# Patient Record
Sex: Female | Born: 1985 | Hispanic: Yes | Marital: Married | Smoking: Never smoker
Health system: Southern US, Community
[De-identification: ages and names within clinical notes are randomized; demographics above are authoritative.]

## PROBLEM LIST (undated history)

## (undated) ENCOUNTER — Inpatient Hospital Stay (HOSPITAL_COMMUNITY): Payer: Self-pay

## (undated) DIAGNOSIS — R112 Nausea with vomiting, unspecified: Secondary | ICD-10-CM

## (undated) DIAGNOSIS — J45909 Unspecified asthma, uncomplicated: Secondary | ICD-10-CM

## (undated) DIAGNOSIS — Z9889 Other specified postprocedural states: Secondary | ICD-10-CM

## (undated) HISTORY — DX: Nausea with vomiting, unspecified: R11.2

## (undated) HISTORY — PX: OVARIAN CYST REMOVAL: SHX89

## (undated) HISTORY — DX: Other specified postprocedural states: Z98.890

---

## 2005-03-19 DIAGNOSIS — IMO0002 Reserved for concepts with insufficient information to code with codable children: Secondary | ICD-10-CM

## 2016-03-19 ENCOUNTER — Encounter (HOSPITAL_COMMUNITY): Payer: Self-pay | Admitting: *Deleted

## 2016-03-19 ENCOUNTER — Inpatient Hospital Stay (HOSPITAL_COMMUNITY)
Admission: AD | Admit: 2016-03-19 | Discharge: 2016-03-19 | Disposition: A | Discharge status: Home / Self Care | Payer: Self-pay | Source: Ambulatory Visit | Attending: Obstetrics and Gynecology | Admitting: Obstetrics and Gynecology

## 2016-03-19 DIAGNOSIS — B3731 Acute candidiasis of vulva and vagina: Secondary | ICD-10-CM

## 2016-03-19 DIAGNOSIS — Z3A15 15 weeks gestation of pregnancy: Secondary | ICD-10-CM | POA: Insufficient documentation

## 2016-03-19 DIAGNOSIS — B373 Candidiasis of vulva and vagina: Secondary | ICD-10-CM | POA: Insufficient documentation

## 2016-03-19 DIAGNOSIS — O2342 Unspecified infection of urinary tract in pregnancy, second trimester: Secondary | ICD-10-CM | POA: Insufficient documentation

## 2016-03-19 DIAGNOSIS — O98812 Other maternal infectious and parasitic diseases complicating pregnancy, second trimester: Secondary | ICD-10-CM | POA: Insufficient documentation

## 2016-03-19 HISTORY — DX: Unspecified asthma, uncomplicated: J45.909

## 2016-03-19 LAB — WET PREP, GENITAL
CLUE CELLS WET PREP: NONE SEEN
Sperm: NONE SEEN
Trich, Wet Prep: NONE SEEN

## 2016-03-19 LAB — URINALYSIS, ROUTINE W REFLEX MICROSCOPIC
Bilirubin Urine: NEGATIVE
GLUCOSE, UA: NEGATIVE mg/dL
KETONES UR: NEGATIVE mg/dL
Nitrite: POSITIVE — AB
PROTEIN: NEGATIVE mg/dL
Specific Gravity, Urine: >1.03 — ABNORMAL HIGH (ref 1.005–1.030)
pH: 5 (ref 5.0–8.0)

## 2016-03-19 LAB — URINE MICROSCOPIC-ADD ON: RBC / HPF: NONE SEEN RBC/hpf (ref 0–5)

## 2016-03-19 MED ORDER — PHENAZOPYRIDINE HCL 100 MG PO TABS
200.0000 mg | ORAL_TABLET | Freq: Once | ORAL | Status: AC
  Administered 2016-03-19: 200 mg via ORAL
  Filled 2016-03-19: qty 2

## 2016-03-19 MED ORDER — CEPHALEXIN 500 MG PO CAPS: 500.0000 mg | ORAL_CAPSULE | Freq: Four times a day (QID) | ORAL | Status: DC

## 2016-03-19 MED ORDER — MICONAZOLE NITRATE 2 % VA CREA: 1.0000 | TOPICAL_CREAM | Freq: Every day | VAGINAL | Status: DC

## 2016-03-19 MED ORDER — NITROFURANTOIN MONOHYD MACRO 100 MG PO CAPS
100.0000 mg | ORAL_CAPSULE | Freq: Once | ORAL | Status: AC
  Administered 2016-03-19: 100 mg via ORAL
  Filled 2016-03-19: qty 1

## 2016-03-19 MED ORDER — PHENAZOPYRIDINE HCL 200 MG PO TABS: 200.0000 mg | ORAL_TABLET | Freq: Three times a day (TID) | ORAL | Status: DC | PRN

## 2016-03-19 NOTE — MAU Note (Addendum)
Pain in LLQ for 3 days. Feels like pressure. Denies LOF or bleeding. States just [place] 2 [place] ago from [place]. Had u/s there and was given EDC of 09-05-16. LMP 11-30-15. Was considered high risk due to previous fetal demises at 4612 and 20wks. 2 prior C/S in [place]

## 2016-03-19 NOTE — MAU Provider Note (Signed)
Chief Complaint: Abdominal Pain   First Provider Initiated Contact with Patient 03/19/16 1511     SUBJECTIVE HPI: Valerie [name] is a 30 y.o. [id number] at [redacted]w[redacted]d who presents to Maternity Admissions reporting LLQ pain x 3 days. Has [place] in [place] 2 weeks ago showing live baby, S=D and possible separated amnion per pt.   Location: Left lower quadrant Quality: Pressure Severity: 6/10 on pain scale Duration: 3 days Context: None Timing: Intermittent Modifying factors: Worse with leg bent Associated signs and symptoms: Positive for urinary frequency and urgency. Negative for fever, chills, hematuria, vaginal bleeding, vaginal discharge.   History of IUFD at 6 months. Patient states baby did not grow well. Also history of three-month SAB or baby did not grow per patient. Patient plans to get prenatal care at [facility] Department, but does not have first appointment scheduled yet.  Past Medical History  Diagnosis Date  . Asthma    OB History  Gravida Para Term Preterm AB SAB TAB Ectopic Multiple Living  5 3 2 1 1 1    2     # Outcome Date GA Lbr Len/2nd Weight Sex Delivery Anes PTL Lv  5 Current           4 Term 07/08/10     CS-LTranv   Y  3 Term 08/25/07    Valerie [name]   Y  2 SAB 03/19/06          1 Preterm 03/19/05 [redacted]w[redacted]d       FD     Complications: Fetal growth restriction     Past Surgical History  Procedure Laterality Date  . Cesarean section     Social History   Social History  . Marital Status: Married    Spouse Name: N/A  . Number of Children: N/A  . Years of Education: N/A   Occupational History  . Not on file.   Social History Main Topics  . Smoking status: Never Smoker   . Smokeless tobacco: Not on file  . Alcohol Use: No  . Drug Use: No  . Sexual Activity: Yes   Other Topics Concern  . Not on file   Social History Narrative  . No narrative on file   No current facility-administered medications on file prior to encounter.   No current  outpatient prescriptions on file prior to encounter.   No Known Allergies  I have reviewed the past Medical Hx, Surgical Hx, Social Hx, Allergies and Medications.   Review of Systems  Constitutional: Negative for fever and chills.  Gastrointestinal: Positive for abdominal pain. Negative for nausea, vomiting, diarrhea, constipation and abdominal distention.  Genitourinary: Positive for urgency and frequency. Negative for dysuria, flank pain, vaginal bleeding and vaginal discharge.  Musculoskeletal: Negative for back pain.    OBJECTIVE Patient Vitals for the past 24 hrs:  BP Temp Pulse Resp Height Weight  03/19/16 1639 (!) 116/54 mmHg - (!) 57 18 - -  03/19/16 1435 119/62 mmHg 97.8 F (36.6 C) 71 18 5\' 5"  (1.651 m) 215 lb 12.8 oz (97.886 kg)   Constitutional: Well-developed, well-nourished female in no acute distress.  Cardiovascular: normal rate Respiratory: normal rate and effort.  GI: Abd soft, mild SP tenderness, gravid appropriate for gestational age. Pos BS x 4 MS: Extremities nontender, no edema, normal ROM Neurologic: Alert and oriented x 4.  GU: Neg CVAT.  SPECULUM EXAM: NEFG, moderate amount of thick, whie, curd-like discharge, no blood noted, cervix clean  BIMANUAL: cervix closed; uterus 16-week  size, no adnexal tenderness or masses. No CMT.  LAB RESULTS Results for orders placed or performed during the hospital encounter of 03/19/16 (from the past 24 hour(s))  Urinalysis, Routine w reflex microscopic (not at [facility])     Status: Abnormal   Collection Time: 03/19/16  2:30 PM  Result Value Ref Range   Color, Urine YELLOW YELLOW   APPearance HAZY (A) CLEAR   Specific Gravity, Urine >1.030 (H) 1.005 - 1.030   pH 5.0 5.0 - 8.0   Glucose, UA NEGATIVE NEGATIVE mg/dL   Hgb urine dipstick TRACE (A) NEGATIVE   Bilirubin Urine NEGATIVE NEGATIVE   Ketones, ur NEGATIVE NEGATIVE mg/dL   Protein, ur NEGATIVE NEGATIVE mg/dL   Nitrite POSITIVE (A) NEGATIVE   Leukocytes, UA LARGE  (A) NEGATIVE  Urine microscopic-add on     Status: Abnormal   Collection Time: 03/19/16  2:30 PM  Result Value Ref Range   Squamous Epithelial / LPF 6-30 (A) NONE SEEN   WBC, UA 6-30 0 - 5 WBC/hpf   RBC / HPF NONE SEEN 0 - 5 RBC/hpf   Bacteria, UA MANY (A) NONE SEEN   Crystals CA OXALATE CRYSTALS (A) NEGATIVE  Wet prep, genital     Status: Abnormal   Collection Time: 03/19/16  3:25 PM  Result Value Ref Range   Yeast Wet Prep HPF POC PRESENT (A) NONE SEEN   Trich, Wet Prep NONE SEEN NONE SEEN   Clue Cells Wet Prep HPF POC NONE SEEN NONE SEEN   WBC, Wet Prep HPF POC MANY (A) NONE SEEN   Sperm NONE SEEN     IMAGING No results found.  MAU COURSE UA, Wet prep, GC/Chlamydia, Urine culture. Macrobid given.   MDM - UTI w/out evidence of Pyelo. Pt non-toxic appearing.  - Pos FHTs - VVC  ASSESSMENT 1. UTI in pregnancy, antepartum, second trimester   2. Vaginal yeast infection     PLAN Discharge home in stable condition. Second trimester Precautions Urine culture, GC/Chlamydia pending. Rx Keflex OTC Monistat     Follow-up Information    Follow up with [facility].   Why:  Start prenatal care   Contact information:   [address] [place] [address] [phone]       Follow up with [facility].   Specialty:  Obstetrics and Gynecology   Why:  Will call you to schedule appointment. If an appointment is available at [facility] outpatient clinic before the health department please cancel department appointment and keep your appointment at the clinic.   Contact information:   [address] [place] [address] [phone]      Follow up with [facility].   Why:  As needed if symptoms worsen   Contact information:   [address] [id number] mc [place] [address] [phone]       Medication List    TAKE these medications        cephALEXin  500 MG capsule  Commonly known as:  KEFLEX  Take 1 capsule (500 mg total) by mouth 4 (four) times daily.     folic acid 400 MCG tablet  Commonly known as:  FOLVITE  Take 400 mcg by mouth daily.     miconazole 2 % vaginal cream  Commonly known as:  MONISTAT 7  Place 1 Applicatorful vaginally at bedtime.     phenazopyridine 200 MG tablet  Commonly known as:  PYRIDIUM  Take 1 tablet (  200 mg total) by mouth 3 (three) times daily as needed for pain.     prenatal multivitamin Tabs tablet  Take 1 tablet by mouth daily at 12 noon.         [place] Davionne Dowty, CNM 03/19/2016  4:43 PM

## 2016-03-19 NOTE — Discharge Instructions (Signed)
Embarazo e infeccin del tracto urinario (Pregnancy and Urinary Tract Infection) Una infeccin urinaria (IU) puede ocurrir en Clinical cytogeneticist del tracto urinario. La infeccin urinaria puede [employer] utteres, los riones (pielonefritis), la vejiga (cistitis) y Geologist, engineering (uretritis). Todas las mujeres embarazadas deben ser estudiadas para diagnosticar la presencia de bacterias en el tracto urinario. La identificacin y el tratamiento de una infeccin urinaria disminuye el riesgo de un parto prematuro y de Actor infecciones ms graves en la madre y el beb. CAUSAS Las bacterias causan casi todas las infecciones urinarias.  FACTORES DE RIESGO Hay muchos factores que pueden aumentar sus probabilidades de contraer una infeccin urinaria (IU) durante el [place]. Pueden ser:  Lucilla Edin uretra corta.  Falta de aseo y malos hbitos de higiene.  [place].  Obstruccin de la orina en el tracto urinario.  Problemas con los msculos o nervios plvicos.  Diabetes.  Obesidad.  Problemas en la vejiga despus de tener varios hijos.  Antecedentes de infeccin urinaria. SIGNOS Y SNTOMAS   Dolor, ardor o sensacin de ardor al [employer].  Sentir la necesidad de Garment/textile technologist de inmediato [place]).  Prdida del control vesical (incontinencia urinaria).  Orinar con ms frecuencia de lo comn en el embarazo.  Malestar en la zona inferior del abdomen o en la espalda.  Bennie Hind turbia.  Sangre en la orina (hematuria).  Cristy Hilts. Cuando se infectan los riones, los sntomas pueden ser:  Dolor de espalda.  Dolor lateral en el lado derecho ms que en el lado izquierdo.  Cristy Hilts.  Escalofros.  Nuseas.  Vmitos. DIAGNSTICO  Una infeccin del tracto urinario se suele diagnosticar a travs de la orina. A veces se realizan pruebas y procedimientos adicionales. Estos pueden ser:  Steward Drone de los riones, los urteres, la vejiga y Geologist, engineering.  Observar la vejiga con un  tubo que ilumina (cistoscopa). TRATAMIENTO Por lo general, las IU pueden tratarse con medicamentos antibiticos.  INSTRUCCIONES PARA EL CUIDADO EN EL HOGAR   Tome slo medicamentos de venta libre o recetados, segn las indicaciones del mdico. Si le han recetado antibiticos, tmelos segn las indicaciones. Tmelos todos, aunque se sienta mejor.  Beba suficiente lquido para Consulting civil engineer orina clara o de color amarillo plido.  No tenga relaciones sexuales hasta que la infeccin haya desaparecido o el mdico la autorice.  Asegrese de [place] [employer] hagan estudios para Hydrographic surveyor una infeccin urinaria durante el [place]. Estas infecciones suelen reaparecer. Para prevenir una infeccin urinaria en el futuro  Practique buenos hbitos higinicos. Siempre debe limpiarse desde adelante hacia atrs. Use el tissue slo una vez.  No retenga la orina. Orine tan pronto como sea posible cuando tenga ganas.  No se haga duchas vaginales ni use desodorantes en aerosol.  Lave con agua tibia y jabn alrededor de la zona genital y el ano.  Vace la vejiga antes y despus de Clinical biochemist.  Use ropa interior con algodn en la entrepierna.  Evite la cafena y las bebidas gaseosas. Estas sustancias irritan la vejiga.  Beba jugo de arndanos o tome comprimidos de arndano. Esto puede disminuir el riesgo de sufrir una infeccin urinaria.  No beba alcohol.  Cumpla con las visitas de control y hgase todos los anlisis segn lo programado. SOLICITE ATENCIN MDICA SI:   Los sntomas empeoran.  Tiene fiebre an despus de 2 das [place].  Tiene una erupcin.  Siente que usted tiene problemas con los medicamentos recetados.  Tiene flujo vaginal anormal. SOLICITE ATENCIN MDICA DE INMEDIATO SI:  Siente dolor en la espalda o a los lados.  Tiene escalofros.  Observa sangre en la orina.  Tiene nuseas o vmitos.  Siente contracciones en el tero.  Tiene una  perdida de lquido en chorro por la vagina. ASEGRESE DE QUE:  Comprende estas instrucciones.   Controlar su afeccin.   Recibir ayuda de inmediato si no mejora o si empeora.    Esta informacin no tiene Theme park manager el consejo del mdico. Asegrese de hacerle al mdico cualquier pregunta que tenga.   Document Released: 07/01/2012 Document Revised: 07/28/2013 Elsevier Interactive Patient Education 2016 [employer].   Vaginitis monilisica (Monilial Vaginitis) La vaginitis es una inflamacin (irritacin, hinchazn) de la vagina y la vulva. Esta no es una enfermedad de transmisin sexual.  CAUSAS Este tipo de vaginitis lo causa un hongo (candida) que normalmente se encuentra en la vagina. El hongo candida se ha desarrollado hasta el punto de ocasionar problemas en el equilibrio qumico. SNTOMAS  Secrecin vaginal espesa y blanca.  Hinchazn, picazn, enrojecimiento e inflamacin de la vagina y en algunos casos de los labios vaginales (vulva).  Ardor o dolor al [employer].  Dolor en las relaciones sexuales. DIAGNSTICO Los factores que favorecen la vaginitis moniliasica son:  Everlean Patterson de virginidad y postmenopusicas.  Embarazo.  Infecciones.  Sentir cansancio, estar enferma o estresada, especialmente si ya ha sufrido este problema en el pasado.  Diabetes Buen control ayudar a disminur la probabilidad.  Pldoras anticonceptivas  Ropa interior [place].  El uso de espumas de bao, aerosoles femeninos duchas vaginales o tampones con desodorante.  Algunos antibiticos (medicamentos que destruyen grmenes).  Si contrae alguna enfermedad puede sufrir recurrencias espordicas. TRATAMIENTO El profesional que lo asiste prescribir medicamentos.  Hay diferentes tipos de cremas y supositorios vaginales que tratan especficamente la vaginitis monilisica. Para infecciones por hongos recurrentes, utilice un supositorio o crema en la vagina dos veces por semana, o  segn se le indique.  Tambin podrn utilizarse cremas con corticoides o anti monilisicas para la picazn o la irritacin de la vulva. Consulte con el profesional que la asiste.  Si la crema no da resultado, podr aplicarse en la vagina una solucin con azul de metileno.  El consumo de yogur puede prevenir este tipo de vaginitis. INSTRUCCIONES PARA EL CUIDADO DOMICILIARIO  Tome todos los medicamentos tal como se le indic.  No mantenga relaciones sexuales hasta que el tratamiento se haya completado, o segn las indicaciones del profesional que la asiste.  Tome baos de asiento tibios.  No se aplique duchas vaginales.  No utilice tampones, especialmente los perfumados.  Use ropa interior de algodn  Mirant pantalones ajustados y las medias tipo panty.  Comunique a sus compaeros sexuales que sufre una infeccin por hongos. Ellos deben concurrir para un control mdico si tienen sntomas como una urticaria leve o picazn.  Sus compaeros sexuales deben tratarse tambin si la infeccin es difcil de Pharmacologist.  Practique el sexo seguro - use condones  Algunos medicamentos vaginales ocasionan fallas en los condones de ltex. Los medicamentos vaginales que pueden daar los condones son:  Chiropodist cleocina  Butoconazole (Femstat)  Terconazole (Terazol) supositorios vaginales  Miconazole (Monistat) (es un medicamento de venta libre) SOLICITE ATENCIN MDICA SI:  Daphane Shepherd tiene una temperatura oral de ms de 38,9 C (102 F).  Si la infeccin empeora luego de 2 [address]  Si la infeccin no mejora luego de 3 [address].  Aparecen ampollas en o alrededor de la vagina.  Si aparece Neomia Dear  hemorragia vaginal y no es el momento del perodo.  Siente dolor al ConocoPhillipsorinar.  Presenta problemas intestinales.  Tiene dolor durante las The St. Paul Travelersrelaciones sexuales.   Esta informacin no tiene Theme park managercomo fin reemplazar el consejo del mdico. Asegrese de hacerle al mdico cualquier  pregunta que tenga.   Document Released: 07/17/2005 Document Revised: 12/30/2011 Elsevier Interactive Patient Education [employer]2016 Elsevier Inc.

## 2016-03-19 NOTE — Progress Notes (Signed)
Ivonne AndrewV. Smith CNM in to discuss test results and d/c plan to include prescriptions. Viria, Spanish interpreter, helping with d/c instructions. Written and verbal d/c instructions given and understanding voiced.

## 2016-03-19 NOTE — Progress Notes (Signed)
I assisted CNM [place] with some questions about her medical history, by Orlan LeavensViria Alvarez Spanish Interpreter.

## 2016-03-20 LAB — GC/CHLAMYDIA PROBE AMP (~~LOC~~) NOT AT ARMC
CHLAMYDIA, DNA PROBE: NEGATIVE
Neisseria Gonorrhea: NEGATIVE

## 2016-03-22 LAB — CULTURE, OB URINE
Culture: >=100000 — AB
SPECIAL REQUESTS: NORMAL

## 2016-04-29 ENCOUNTER — Encounter (HOSPITAL_COMMUNITY): Payer: Self-pay | Admitting: Obstetrics and Gynecology

## 2016-04-29 ENCOUNTER — Encounter: Payer: Self-pay | Admitting: Obstetrics and Gynecology

## 2016-04-29 ENCOUNTER — Ambulatory Visit (INDEPENDENT_AMBULATORY_CARE_PROVIDER_SITE_OTHER): Payer: BLUE CROSS/BLUE SHIELD | Admitting: Obstetrics and Gynecology

## 2016-04-29 VITALS — BP 109/56 | HR 71 | Wt 212.0 lb

## 2016-04-29 DIAGNOSIS — Z113 Encounter for screening for infections with a predominantly sexual mode of transmission: Secondary | ICD-10-CM

## 2016-04-29 DIAGNOSIS — Z124 Encounter for screening for malignant neoplasm of cervix: Secondary | ICD-10-CM

## 2016-04-29 DIAGNOSIS — O99820 Streptococcus B carrier state complicating pregnancy: Secondary | ICD-10-CM | POA: Insufficient documentation

## 2016-04-29 DIAGNOSIS — O09292 Supervision of pregnancy with other poor reproductive or obstetric history, second trimester: Secondary | ICD-10-CM

## 2016-04-29 DIAGNOSIS — Z1151 Encounter for screening for human papillomavirus (HPV): Secondary | ICD-10-CM

## 2016-04-29 DIAGNOSIS — O9989 Other specified diseases and conditions complicating pregnancy, childbirth and the puerperium: Secondary | ICD-10-CM

## 2016-04-29 DIAGNOSIS — O0990 Supervision of high risk pregnancy, unspecified, unspecified trimester: Secondary | ICD-10-CM | POA: Insufficient documentation

## 2016-04-29 DIAGNOSIS — J45909 Unspecified asthma, uncomplicated: Secondary | ICD-10-CM | POA: Insufficient documentation

## 2016-04-29 DIAGNOSIS — O2342 Unspecified infection of urinary tract in pregnancy, second trimester: Secondary | ICD-10-CM

## 2016-04-29 DIAGNOSIS — Z98891 History of uterine scar from previous surgery: Secondary | ICD-10-CM | POA: Insufficient documentation

## 2016-04-29 DIAGNOSIS — O2340 Unspecified infection of urinary tract in pregnancy, unspecified trimester: Secondary | ICD-10-CM

## 2016-04-29 DIAGNOSIS — IMO0002 Reserved for concepts with insufficient information to code with codable children: Secondary | ICD-10-CM

## 2016-04-29 DIAGNOSIS — O99519 Diseases of the respiratory system complicating pregnancy, unspecified trimester: Secondary | ICD-10-CM

## 2016-04-29 DIAGNOSIS — Z8759 Personal history of other complications of pregnancy, childbirth and the puerperium: Secondary | ICD-10-CM

## 2016-04-29 DIAGNOSIS — Z3490 Encounter for supervision of normal pregnancy, unspecified, unspecified trimester: Secondary | ICD-10-CM

## 2016-04-29 LAB — POCT URINALYSIS DIP (DEVICE)
Glucose, UA: NEGATIVE mg/dL
HGB URINE DIPSTICK: NEGATIVE
KETONES UR: NEGATIVE mg/dL
Nitrite: NEGATIVE
PH: 5.5 (ref 5.0–8.0)
PROTEIN: NEGATIVE mg/dL
Specific Gravity, Urine: 1.025 (ref 1.005–1.030)
Urobilinogen, UA: 0.2 mg/dL (ref 0.0–1.0)

## 2016-04-29 MED ORDER — PRENATAL VITAMINS 0.8 MG PO TABS: 1.0000 | ORAL_TABLET | Freq: Every day | ORAL | Status: DC

## 2016-04-29 NOTE — Progress Notes (Signed)
[name] used for interpreter for check in

## 2016-04-29 NOTE — Progress Notes (Signed)
Subjective:  Valerie [name] is a 30 y.o. [phone] at 7239w4d being seen today for initial prenatal care.  She is currently monitored for the following issues for this high-risk pregnancy and has Asthma affecting pregnancy, antepartum; Supervision of low-risk pregnancy; History IUFD (intrauterine fetal death); UTI in pregnancy; and History of 2 cesarean sections on her problem list.  Patient reports no complaints.  Contractions: Not present. Vag. Bleeding: None.  Movement: Present. Denies leaking of fluid.   The following portions of the patient's history were reviewed and updated as appropriate: allergies, current medications, past family history, past medical history, past social history, past surgical history and problem list. Problem list updated.  Objective:   Filed Vitals:   04/29/16 1006  BP: 109/56  Pulse: 71  Weight: 212 lb (96.163 kg)    Fetal Status:     Movement: Present     General:  Alert, oriented and cooperative. Patient is in no acute distress.  Skin: Skin is warm and dry. No rash noted.   Cardiovascular: Normal heart rate noted  Respiratory: Normal respiratory effort, no problems with respiration noted  Abdomen: Soft, gravid, appropriate for gestational age. Pain/Pressure: Absent     Pelvic:  Cervical exam deferred        Extremities: Normal range of motion.  Edema: None  Mental Status: Normal mood and affect. Normal behavior. Normal judgment and thought content.   Urinalysis: Urine Protein: Negative Urine Glucose: Negative  Assessment and Plan:  Pregnancy: [id number] at 3839w4d  1. Pregnancy - Prenatal Profile - Hemoglobinopathy evaluation - Culture, OB Urine - Pain Mgmt, Profile 6 Conf w/o mM, U - Glucose Tolerance, 1 HR (50g) w/o Fasting - GC/Chlamydia probe amp ([place] - US MFM OB COMP + 14 WK; Future - AMB referral to maternal fetal medicine  2. Asthma affecting pregnancy, antepartum - mild intermittent  3. Supervision of low-risk  pregnancy, unspecified trimester - AFP, Quad Screen  4. History IUFD (intrauterine fetal death) - meet w/ mfm after u/s to determine whether antenatal testing recommended  5. UTI in pregnancy, unspecified trimester  toc today  6. History of 2 cesarean sections Desires repeat   Preterm labor symptoms and general obstetric precautions including but not limited to vaginal bleeding, contractions, leaking of fluid and fetal movement were reviewed in detail with the patient. Please refer to After Visit Summary for other counseling recommendations.  F/u 4 wks  Kathrynn RunningNoah Bedford Fadi Menter, MD

## 2016-04-30 LAB — GC/CHLAMYDIA PROBE AMP (~~LOC~~) NOT AT ARMC
Chlamydia: NEGATIVE
NEISSERIA GONORRHEA: NEGATIVE

## 2016-04-30 LAB — PAIN MGMT, PROFILE 6 CONF W/O MM, U
6 ACETYLMORPHINE: NEGATIVE ng/mL (ref <10)
AMPHETAMINES: NEGATIVE ng/mL (ref <500)
Alcohol Metabolites: NEGATIVE ng/mL (ref <500)
BARBITURATES: NEGATIVE ng/mL (ref <300)
Benzodiazepines: NEGATIVE ng/mL (ref <100)
CREATININE: 217 mg/dL (ref >=20.0)
Cocaine Metabolite: NEGATIVE ng/mL (ref <150)
Marijuana Metabolite: NEGATIVE ng/mL (ref <20)
Methadone Metabolite: NEGATIVE ng/mL (ref <100)
OXIDANT: NEGATIVE ug/mL (ref <200)
Opiates: NEGATIVE ng/mL (ref <100)
Oxycodone: NEGATIVE ng/mL (ref <100)
PH: 6.56 (ref 4.5–9.0)
PHENCYCLIDINE: NEGATIVE ng/mL (ref <25)
Please note:: 0

## 2016-04-30 LAB — GLUCOSE TOLERANCE, 1 HOUR (50G) W/O FASTING: Glucose, 1 Hr, gestational: 130 mg/dL (ref <140)

## 2016-05-01 LAB — PRENATAL PROFILE (SOLSTAS)
Antibody Screen: NEGATIVE
BASOS ABS: 0 {cells}/uL (ref 0–200)
Basophils Relative: 0 %
EOS ABS: 294 {cells}/uL (ref 15–500)
Eosinophils Relative: 3 %
HCT: 35 % (ref 35.0–45.0)
HEP B S AG: NEGATIVE
HIV: NONREACTIVE
Hemoglobin: 11 g/dL — ABNORMAL LOW (ref 11.7–15.5)
LYMPHS ABS: 1568 {cells}/uL (ref 850–3900)
LYMPHS PCT: 16 %
MCH: 24.1 pg — ABNORMAL LOW (ref 27.0–33.0)
MCHC: 31.4 g/dL — AB (ref 32.0–36.0)
MCV: 76.8 fL — ABNORMAL LOW (ref 80.0–100.0)
MONO ABS: 490 {cells}/uL (ref 200–950)
MPV: 10.3 fL (ref 7.5–12.5)
Monocytes Relative: 5 %
NEUTROS PCT: 76 %
Neutro Abs: 7448 cells/uL (ref 1500–7800)
PLATELETS: 278 10*3/uL (ref 140–400)
RBC: 4.56 MIL/uL (ref 3.80–5.10)
RDW: 17.3 % — AB (ref 11.0–15.0)
RH TYPE: POSITIVE
RUBELLA: 4.06 {index} — AB (ref <0.90)
WBC: 9.8 10*3/uL (ref 3.8–10.8)

## 2016-05-01 LAB — CULTURE, OB URINE
COLONY COUNT: NO GROWTH
ORGANISM ID, BACTERIA: NO GROWTH

## 2016-05-07 LAB — AFP, QUAD SCREEN
AFP: 47.1 ng/mL
CURR GEST AGE: 21.6 wk
Down Syndrome Scr Risk Est: 1:753 {titer}
HCG, Total: 24.05 IU/mL
INH: 151.1 pg/mL
Interpretation-AFP: NEGATIVE
MOM FOR AFP: 0.83
MOM FOR INH: 1.07
MoM for hCG: 1.72
Open Spina bifida: NEGATIVE
Tri 18 Scr Risk Est: NEGATIVE
UE3 MOM: 0.79
UE3 VALUE: 1.88 ng/mL

## 2016-05-10 ENCOUNTER — Ambulatory Visit (HOSPITAL_COMMUNITY)
Admission: RE | Admit: 2016-05-10 | Discharge: 2016-05-10 | Disposition: A | Discharge status: Home / Self Care | Payer: BLUE CROSS/BLUE SHIELD | Source: Ambulatory Visit | Attending: Obstetrics and Gynecology | Admitting: Obstetrics and Gynecology

## 2016-05-10 ENCOUNTER — Other Ambulatory Visit (HOSPITAL_COMMUNITY): Payer: Self-pay

## 2016-05-10 ENCOUNTER — Encounter (HOSPITAL_COMMUNITY): Payer: Self-pay

## 2016-05-10 ENCOUNTER — Other Ambulatory Visit: Payer: Self-pay | Admitting: Obstetrics and Gynecology

## 2016-05-10 DIAGNOSIS — IMO0002 Reserved for concepts with insufficient information to code with codable children: Secondary | ICD-10-CM

## 2016-05-10 DIAGNOSIS — O9989 Other specified diseases and conditions complicating pregnancy, childbirth and the puerperium: Secondary | ICD-10-CM | POA: Diagnosis not present

## 2016-05-10 DIAGNOSIS — Z98891 History of uterine scar from previous surgery: Secondary | ICD-10-CM

## 2016-05-10 DIAGNOSIS — Z3490 Encounter for supervision of normal pregnancy, unspecified, unspecified trimester: Secondary | ICD-10-CM

## 2016-05-10 DIAGNOSIS — O34219 Maternal care for unspecified type scar from previous cesarean delivery: Secondary | ICD-10-CM | POA: Diagnosis not present

## 2016-05-10 DIAGNOSIS — Z36 Encounter for antenatal screening of mother: Secondary | ICD-10-CM | POA: Insufficient documentation

## 2016-05-10 DIAGNOSIS — J45909 Unspecified asthma, uncomplicated: Secondary | ICD-10-CM | POA: Diagnosis not present

## 2016-05-10 DIAGNOSIS — Z3A23 23 weeks gestation of pregnancy: Secondary | ICD-10-CM | POA: Diagnosis not present

## 2016-05-10 DIAGNOSIS — O09299 Supervision of pregnancy with other poor reproductive or obstetric history, unspecified trimester: Secondary | ICD-10-CM

## 2016-05-10 DIAGNOSIS — O99519 Diseases of the respiratory system complicating pregnancy, unspecified trimester: Secondary | ICD-10-CM

## 2016-05-10 DIAGNOSIS — O09292 Supervision of pregnancy with other poor reproductive or obstetric history, second trimester: Secondary | ICD-10-CM | POA: Diagnosis not present

## 2016-05-10 DIAGNOSIS — O2342 Unspecified infection of urinary tract in pregnancy, second trimester: Secondary | ICD-10-CM

## 2016-05-10 DIAGNOSIS — Z1389 Encounter for screening for other disorder: Secondary | ICD-10-CM

## 2016-05-10 DIAGNOSIS — O2340 Unspecified infection of urinary tract in pregnancy, unspecified trimester: Secondary | ICD-10-CM

## 2016-05-10 NOTE — Progress Notes (Signed)
30 year old, [id number], currently st [redacted] weeks gestation seen in consultation today for history of IUFD.   Past Medical History  Diagnosis Date  . Asthma   . PONV (postoperative nausea and vomiting)    Past Surgical History  Procedure Laterality Date  . Cesarean section    . Ovarian cyst removal     No family history on file.  Social History   Social History  . Marital Status: Married    Spouse Name: N/A  . Number of Children: N/A  . Years of Education: N/A   Occupational History  . Not on file.   Social History Main Topics  . Smoking status: Never Smoker   . Smokeless tobacco: Never Used  . Alcohol Use: No  . Drug Use: No  . Sexual Activity: Yes   Other Topics Concern  . Not on file   Social History Narrative   No Known Allergies  Current Outpatient Prescriptions on File Prior to Encounter  Medication Sig Dispense Refill  . Prenatal Multivit-Min-Fe-FA (PRENATAL VITAMINS) 0.8 MG tablet Take 1 tablet by mouth daily. 90 tablet 4  . Prenatal Vit-Fe Fumarate-FA (PRENATAL MULTIVITAMIN) TABS tablet Take 1 tablet by mouth daily at 12 noon.     No current facility-administered medications on file prior to encounter.   OB History  Gravida Para Term Preterm AB SAB TAB Ectopic Multiple Living  5 3 2 1 1 1  0 0 0 2    # Outcome Date GA Lbr Len/2nd Weight Sex Delivery Anes PTL Lv  5 Current           4 Term 07/08/10 3223w0d  7 lb 11.5 oz (3.5 kg) M CS-LTranv  N Y     Comments: was on bed rest early in pregnancy due to placenta (location ?)  3 Term 08/25/07 223w0d  7 lb 11.5 oz (3.5 kg) M CS-LTranv   Y     Comments: scheduled c/s due to umbilical cord wrapped around neck  2 SAB 03/19/06          1 Preterm 03/19/05 4917w0d       FD     Complications: Fetal growth restriction     Impression and Recommendations #1 History of intrauterine fetal demise at ~[redacted] weeks gestation - She notes through a translator that the baby was normally for that point in pregnancy and did not have  apparent chromosomal abnormality or external birth defects.  - She has had 2 normally grown term pregnancies - Ultrasound today had a normally grown fetus with normal appearing amniotic fluid and no apparent birth defects - Will repeat ultrasound in ~4-6 weeks. If continues normally grown, consider antenatal testing at ~ [redacted] weeks gestation. If not normally growing, consider starting antenatal testing sooner as clinically indicated. #2 History of 3 cesarean deliveries in [place] (including one at ~[redacted] weeks gestation per her report) - Counseling by primary provider on review of operative notes  Questions appear answered to her satisfaction. Precaution for the above given. Spent greater than 1/2 of 20 minute visit face to face counseling

## 2016-05-17 ENCOUNTER — Inpatient Hospital Stay (HOSPITAL_COMMUNITY)
Admission: AD | Admit: 2016-05-17 | Discharge: 2016-05-17 | Disposition: A | Discharge status: Home / Self Care | Payer: BLUE CROSS/BLUE SHIELD | Source: Ambulatory Visit | Attending: Obstetrics & Gynecology | Admitting: Obstetrics & Gynecology

## 2016-05-17 ENCOUNTER — Encounter (HOSPITAL_COMMUNITY): Payer: Self-pay | Admitting: *Deleted

## 2016-05-17 DIAGNOSIS — J45909 Unspecified asthma, uncomplicated: Secondary | ICD-10-CM | POA: Diagnosis not present

## 2016-05-17 DIAGNOSIS — O99512 Diseases of the respiratory system complicating pregnancy, second trimester: Secondary | ICD-10-CM | POA: Diagnosis not present

## 2016-05-17 DIAGNOSIS — Z3A24 24 weeks gestation of pregnancy: Secondary | ICD-10-CM

## 2016-05-17 DIAGNOSIS — R0602 Shortness of breath: Secondary | ICD-10-CM | POA: Diagnosis present

## 2016-05-17 DIAGNOSIS — J069 Acute upper respiratory infection, unspecified: Secondary | ICD-10-CM | POA: Diagnosis not present

## 2016-05-17 DIAGNOSIS — J45901 Unspecified asthma with (acute) exacerbation: Secondary | ICD-10-CM | POA: Diagnosis not present

## 2016-05-17 DIAGNOSIS — O99519 Diseases of the respiratory system complicating pregnancy, unspecified trimester: Secondary | ICD-10-CM

## 2016-05-17 MED ORDER — ALBUTEROL SULFATE HFA 108 (90 BASE) MCG/ACT IN AERS: 2.0000 | INHALATION_SPRAY | Freq: Four times a day (QID) | RESPIRATORY_TRACT | 2 refills | Status: DC | PRN

## 2016-05-17 NOTE — MAU Note (Signed)
Has had cold symptoms since Monday. Hx of asthma and having some difficulty breathing. Denies vag bleeding or pain.

## 2016-05-17 NOTE — MAU Note (Signed)
Urine in lab 

## 2016-05-17 NOTE — Discharge Instructions (Signed)
Asma - Adultos (Asthma, Adult) El asma es una enfermedad recurrente en la que las vas respiratorias se estrechan y [place]. Puede causar dificultad para respirar. Provoca tos, sibilancias y sensacin de falta de aire. Los episodios de asma, tambin llamados crisis de asma, pueden ser leves o potencialmente mortales. El asma no puede curarse, pero los [employer] y los cambios en el estilo de vida lo ayudarn a Aeronautical engineer enfermedad. CAUSAS Se cree que la causa del asma son factores hereditarios (genticos) y la exposicin a factores ambientales; sin embargo, su causa exacta se desconoce. El asma generalmente es desencadenada por alrgenos, infecciones en los pulmones o sustancias irritantes que se encuentran en el aire. Los desencadenantes del asma son diferentes para cada persona. Los factores desencadenantes comunes incluyen:   Caspa de los [place].  caros del polvo.  [place].  El polen de los rboles o el csped.  Moho.  Humo.  Sustancias contaminantes como el polvo, limpiadores del hogar, sprays para el cabello, aerosoles, vapores de pintura, sustancias qumicas fuertes u olores intensos.  El [place] fro, los cambios de Scientist, forensic y el viento (que aumenta la cantidad de moho y polen en el aire).  Emociones intensas, como llorar o rer [employer].  Estrs.  Ciertos medicamentos (como la aspirina) o algunos frmacos (como los betabloqueantes).  Los sulfitos que contienen los alimentos y las bebidas. Los alimentos y bebidas que pueden contener sulfitos son las frutas desecadas, las papas fritas y los vinos espumantes.  Enfermedades infecciosas o inflamatorias, como la gripe, el resfro o la inflamacin de las membranas nasales (rinitis).  El reflujo gastroesofgico (ERGE).  Los ejercicios o actividades extenuantes. SNTOMAS Los sntomas pueden ocurrir inmediatamente despus de que se desencadena el asma o muchas horas ms tarde. [place] sntomas  son:  Sibilancias.  Tos excesiva durante la noche o temprano por la maana.  Tos frecuente o intensa durante un resfro comn.  Opresin en el pecho.  Falta de aire. DIAGNSTICO  El diagnstico se realiza mediante la revisin de su historia clnica y de un examen fsico. Es posible que le indiquen algunos estudios. Estos pueden incluir:  Estudios de la funcin pulmonar. Estas pruebas indican cunto aire inhala y exhala.  Pruebas de alergia.  Estudios de diagnstico por imgenes, como radiografas. TRATAMIENTO  El asma no puede curarse, pero puede controlarse. El Tax inspector identificar y Product/process development scientist los factores desencadenantes del asma. Tambin incluye medicamentos. Hay dos tipos de medicamentos utilizados en el tratamiento para el asma:   Medicamentos de control del asma. Impiden que aparezcan los sntomas. Generalmente se [employer].  Medicamentos de [place] o de rescate. Alivian los sntomas rpidamente. Se utilizan cuando es necesario y proporcionan alivio a Control and instrumentation engineer. El mdico lo ayudar a Animal nutritionist de accin para el asma, que es una planificacin por escrito para el control y el tratamiento de las crisis de asma. Incluye una lista de los factores desencadenantes y el modo en que pueden evitarse. Tambin incluye informacin acerca del momento en que se deben Apple Computer y cundo se debe cambiar la dosis. Un plan de accin tambin incluye el uso de un dispositivo llamado espirmetro. El espirmetro es un dispositivo que mide el funcionamiento de los pulmones. Lo ayuda a controlar la enfermedad. [place] los medicamentos solamente como se lo haya indicado el mdico. Hable con el mdico si tiene preguntas acerca de cmo o cundo tomar los medicamentos.  Use un espirmetro de acuerdo  con las indicaciones del mdico. Anote y lleve un registro de los [place].  Conozca y [employer] de accin para ayudar  a minimizar o detener una crisis de asma sin necesidad de buscar atencin mdica.  Controle el ambiente de su hogar de la siguiente manera para prevenir las crisis de asma:  No fume. Evite la exposicin al humo de otros fumadores.  Cambie regularmente el filtro de la calefaccin y del aire acondicionado.  Limite el uso de hogares o estufas a lea.  Elimine las plagas (como cucarachas, ratones) y sus excrementos.  Elimine las plantas si observa moho en ellas.  Limpie habitualmente los pisos y el polvo. Utilice productos sin perfume.  Intente que otra persona pase la aspiradora con regularidad. Permanezca fuera de las habitaciones mientras son aspiradas y por algn tiempo despus. Si usted pasa la [place], use una mscara para polvo de las que se consiguen en la [place], una bolsa de aspiradora de doble capa o microfiltro o una aspiradora con un filtro HEPA.  Reemplace las alfombras por pisos de [place], baldosas o vinilo. Las alfombras pueden retener la caspa de los animales y [place].  Use almohadas, mantas y cubre colchones antialrgicos.  [place] sbanas y las mantas todas las semanas con agua caliente y squelas con aire caliente.  Use mantas de polister o algodn.  Limpie baos y cocinas con lavandina. Si fuera posible, pdale a alguien que vuelva a pintar las paredes de estas habitaciones con Ardelia Mems pintura resistente a los hongos. Aljese de las habitaciones que se estn limpiando y pintando.  Lvese las manos con frecuencia. SOLICITE ATENCIN MDICA SI:   Respira con dificultad an cuando toma el medicamento para prevenir las crisis.  La mucosidad coloreada que expectora cuando tose (esputo) es ms espesa que lo habitual.  Su esputo cambia de un color claro o blanco a un color amarillo, verde, gris o sanguinolento.  Tiene trastornos ocasionados por el medicamento que est tomando (como urticaria, picazn, hinchazn o dificultades respiratorias).  Necesita un  medicamento aliviador ms de 2 o 3 veces por semana.  Su flujo espiratorio mximo se mantiene entre el 50% y el 79% de su Pharmacist, hospital personal, despus de seguir el plan de accin durante 1hora.  Tiene fiebre. SOLICITE ATENCIN MDICA DE INMEDIATO SI:   Usted parece empeorar y no responde al tratamiento durante una crisis de asma.  Le falta el aire, incluso en reposo.  Le falta el aire an cuando hace muy poca actividad fsica.  Tiene dificultad para comer, beber o hablar debido a los sntomas del asma.  Siente dolor en el pecho.  Se le acelera la frecuencia cardaca.  Tiene los labios o las uas de tono [place].  Siente que est por desvanecerse, est mareado o se desmaya.  Su flujo mximo es Garment/textile technologist del 50% del Pharmacist, hospital personal.   Esta informacin no tiene Marine scientist el consejo del mdico. Asegrese de hacerle al mdico cualquier pregunta que tenga.   Document Released: 10/07/2005 Document Revised: 06/28/2015 Elsevier Interactive Patient Education 2016 Segundo crisis de asma (Asthma Attack Prevention) Si bien probablemente no pueda controlar ser [employer], puede tomar medidas para prevenir las crisis de asma. La mejor forma de prevenir las crisis de asma es mantener el asma bajo control. Para lograrlo, puede hacer lo siguiente:  Delphi segn las indicaciones.  Evite las cosas que pueden irritarle las vas respiratorias o intensificar los sntomas de  asma (desencadenantes del asma).  Lleve un registro Research officer, trade union control del asma y [employer]vin[employer]itor cambios en los sntomas.  Acte rpidamente si los sntomas de asma se intensifican (crisis de asma).  Busque atencin mdica de emergencia cuando sea necesario. CULES SON LAS FORMAS DE PREVENIR UNA CRISIS DE ASMA? Tenga un plan [place] con el mdico para crear Ardelia Mems planificacin por escrito para el control y el tratamiento de las crisis de asma (plan  de accin para el asma). Este plan incluye lo siguiente:  Una lista de los factores desencadenantes del asma y el modo en que puede evitarlos.  Informacin acerca del momento en que se deben tomar los medicamentos y cundo hay que cambiar las dosis.  El uso de un dispositivo que mide el funcionamiento de los pulmones (espirmetro). Controle el asma Use el espirmetro y [employer]  en un diario todos [place]. Una reduccin en los nmeros del flujo espiratorio mximo en uno o ms das puede indicar el comienzo de una crisis de asma. Esto puede ocurrir incluso antes de que empiece a Scientist, product/process development los sntomas. Para evitar que una crisis de asma empeore, siga los pasos del plan de accin para el asma. Evite los factores desencadenantes del asma [place] con el mdico especialista en asma para determinar cules son los factores desencadenantes. Esto puede lograrse de la siguiente manera:  Pruebas de Buyer, retail.  Llevar un diario que indique cundo ocurren las crisis de asma y cules son los factores que pueden haber contribuido a que estas sucedan.  Determinar si hay otras enfermedades que intensifican el asma. Una vez que haya determinado cules son los factores desencadenantes del asma, tome las medidas para evitarlos. Estas pueden incluir evitar la exposicin excesiva o prolongada a lo siguiente:  Polvo. Pdale a otra persona que limpie su casa y pase la aspiradora una o dos veces por semana. Es mejor usar una aspiradora con filtro de partculas de alto rendimiento (HEPA).  Humo. Esto incluye el humo de las fogatas, el humo de los incendios forestales y el humo ambiental de los productos que contienen tabaco.  Caspa de las [place]. Evite el contacto con los animales a los cuales sabe que es Air cabin crew.  Futures trader que provienen de los rboles, los pastos o el polen. No pase mucho tiempo al aire libre cuando las concentraciones de polen son elevadas y [place] son muy ventosos.  Aire  muy fro, seco o hmedo.  Moho.  Alimentos con grandes cantidades de sulfitos.  Olores fuertes.  Sustancias contaminantes del aire exterior, [employer] escapes de los motores.  Sustancias contaminantes del aire interior, como los [place] y los vapores de los productos de limpieza del Museum/gallery curator.  Plagas hogareas, entre ellas, los caros del polvo y las [place], y el excremento de las plagas.  Algunos medicamentos, incluidos los antiinflamatorios no esteroides (AINE). Hable siempre con el mdico antes de suspender o de empezar a tomar cualquier medicamento nuevo. Medicamentos Delphi de venta libre y los recetados solamente como se lo haya indicado el mdico. Muchas crisis de asma se pueden prevenir si se sigue cuidadosamente el plan de administracin de medicamentos. Es Scientist, research (life sciences) tomar los medicamentos del modo correcto cuando no se pueden evitar determinados factores desencadenantes del asma. Actuar con rapidez Si se produce una crisis de asma, actuar con rapidez puede atenuar su gravedad y reducir su duracin. Tome estas medidas:   Est atento a los sntomas. Si tiene tos, sibilancias o dificultad para respirar,  no espere hasta que los sntomas desaparezcan por s solos. Siga el plan de accin para el asma.  Si sigui el plan de accin para el asma y los sntomas no mejoran, llame al mdico o solicite atencin mdica de inmediato en el hospital ms cercano. Es importante que tenga en cuenta la frecuencia con la que tiene que usar el inhalador de rescate de accin rpida. Si est utilizando Forensic psychologist de rescate con ms frecuencia, tal vez esto signifique que el asma no est bajo control. Modificar el plan de tratamiento para controlar el asma puede ayudarlo a prevenir las crisis de asma en el futuro y a Scientist, forensic un mejor control de la enfermedad. CMO PUEDO EVITAR UNA CRISIS DE ASMA CUANDO HAGO ACTIVIDAD FSICA? Siga los consejos del mdico respecto de cundo  usar el Tax inspector de accin rpida antes de Field seismologist actividad fsica. Muchas personas que tienen asma sufren la broncoconstriccin inducida por el ejercicio (BCIE). Esta afeccin suele agravarse al realizar actividad fsica enrgica en ambientes fros, hmedos o secos. Generalmente, las personas con BCIE pueden mantenerse muy activas con un tratamiento previo con un inhalador de accin rpida antes de realizar actividad fsica.   Esta informacin no tiene Marine scientist el consejo del mdico. Asegrese de hacerle al mdico cualquier pregunta que tenga.   Document Released: 09/23/2012 Document Revised: 06/28/2015 Elsevier Interactive Patient Education [employer].

## 2016-05-17 NOTE — MAU Note (Signed)
Pt reports cold like symptoms since Sunday.Marland KitchenMarland KitchenMucus, cough, headaches, shortness of breath with walking and lying down. Pt has not had to use an inhaler in one year and does not have one at home. She is receiving care in the clinic and her next appointment is 8/7.

## 2016-05-17 NOTE — MAU Provider Note (Signed)
  History     CSN: [id number]  Arrival date and time: 05/17/16 1941   None     Chief Complaint  Patient presents with  . Shortness of Breath   HPI Ms Lluvia Gluth is a 30yo [id number] @ 24.1wks who presents for eval of SOB x 3d. She has had an URI since 6d. Denies fever. Feels her congestion/cough are improving. She has a hx of asthma but doesn't have an inhaler and no primary doc currently. Her preg has been [facility] the [facility] and has been remarkable for 1) hx C/S x 2 (possibly x 3) 2) hx IUFD@ 24wks from [facility] ([place]) 3) hx asthma. She denies ctx, leak, or bldg or any other preg concerns.  OB History    Gravida Para Term Preterm AB Living   5 3 2 1 1 2    SAB TAB Ectopic Multiple Live Births   1 0 0 0        Past Medical History:  Diagnosis Date  . Asthma   . PONV (postoperative nausea and vomiting)     Past Surgical History:  Procedure Laterality Date  . CESAREAN SECTION    . OVARIAN CYST REMOVAL      No family history on file.  Social History  Substance Use Topics  . Smoking status: Never Smoker  . Smokeless tobacco: Never Used  . Alcohol use No    Allergies: No Known Allergies  Prescriptions Prior to Admission  Medication Sig Dispense Refill Last Dose  . Prenatal Multivit-Min-Fe-FA (PRENATAL VITAMINS) 0.8 MG tablet Take 1 tablet by mouth daily. 90 tablet 4 05/17/2016 at Unknown time    ROS No other pertinents other than what is listed in HPI Physical Exam   Blood pressure 108/58, pulse 92, temperature 98.6 F (37 C), resp. rate 18, height 5\' 6"  (1.676 m), weight 97.5 kg (215 lb), last menstrual period 11/30/2015, SpO2 98 %.  Physical Exam  Constitutional: She is oriented to person, place, and time. She appears well-developed.  HENT:  Head: Normocephalic.  Neck: Normal range of motion.  Cardiovascular: Normal rate.   Respiratory: Effort normal and breath sounds normal.  Sl decreased  GI:  EFM 135-140s, +accels, no decels No ctx per toco   Musculoskeletal: Normal range of motion.  Neurological: She is alert and oriented to person, place, and time.  Skin: Skin is warm and dry.  Psychiatric: She has a normal mood and affect. Her behavior is normal. Thought content normal.    MAU Course  Procedures  MDM NST  Assessment and Plan  IUP@24 .1wks Asthma exacerbation secondary to recent URI  D/C home Rx Albuterol inhaler to use q 6hrs prn- offered breathing tx in MAU but pt declined Has OTC cold medication list F/U as scheduled at next OB visit   Cam Hai CNM 05/17/2016, 9:25 PM

## 2016-05-27 ENCOUNTER — Ambulatory Visit (INDEPENDENT_AMBULATORY_CARE_PROVIDER_SITE_OTHER): Payer: BLUE CROSS/BLUE SHIELD | Admitting: Obstetrics & Gynecology

## 2016-05-27 VITALS — BP 91/45 | HR 67 | Wt 212.9 lb

## 2016-05-27 DIAGNOSIS — IMO0002 Reserved for concepts with insufficient information to code with codable children: Secondary | ICD-10-CM

## 2016-05-27 DIAGNOSIS — Z98891 History of uterine scar from previous surgery: Secondary | ICD-10-CM

## 2016-05-27 DIAGNOSIS — O99512 Diseases of the respiratory system complicating pregnancy, second trimester: Secondary | ICD-10-CM

## 2016-05-27 DIAGNOSIS — Z8759 Personal history of other complications of pregnancy, childbirth and the puerperium: Secondary | ICD-10-CM

## 2016-05-27 DIAGNOSIS — Z3009 Encounter for other general counseling and advice on contraception: Secondary | ICD-10-CM | POA: Insufficient documentation

## 2016-05-27 DIAGNOSIS — J45909 Unspecified asthma, uncomplicated: Secondary | ICD-10-CM

## 2016-05-27 DIAGNOSIS — O99519 Diseases of the respiratory system complicating pregnancy, unspecified trimester: Secondary | ICD-10-CM

## 2016-05-27 DIAGNOSIS — Z3492 Encounter for supervision of normal pregnancy, unspecified, second trimester: Secondary | ICD-10-CM

## 2016-05-27 LAB — POCT URINALYSIS DIP (DEVICE)
Bilirubin Urine: NEGATIVE
Glucose, UA: NEGATIVE mg/dL
Hgb urine dipstick: NEGATIVE
KETONES UR: NEGATIVE mg/dL
Nitrite: NEGATIVE
PH: 5.5 (ref 5.0–8.0)
PROTEIN: 30 mg/dL — AB
Urobilinogen, UA: 0.2 mg/dL (ref 0.0–1.0)

## 2016-05-27 NOTE — Patient Instructions (Signed)
Informacin sobre Engineer, civil (consulting)la esterilizacin en las mujeres  (Sterilization Information, Female) La esterilizacin en la mujer es un procedimiento que se realiza para Location managerevitar el embarazo de Stap[place]manera permanente. Hay diferentes formas de Futures traderrealizar la esterilizacin, Biomedical engineerpero en todos los casos se bloquean o se cierran las trompas de [place] para que vulos no puedan llegar al tero. Si el vulo no llega al tero, los espermatozoides no fertilizan el vulo, y usted no podr Burundiquedar embarazada.  La esterilizacin se lleva a cabo por medio de un procedimiento quirrgico. A veces, estos procedimientos se realizan en el hospital haciendo dormir a Education officer, communityla paciente. En otros casos se realiza en un consultorio en una clnica y la paciente permanece despierta. Las trompas de NordstromFalopio se pueden cortar, Public affairs consultantligar o sellar quirrgicamente, con un procedimiento que se llama ligadura de trompas. Otro mtodo es cerrarlas con clips o anillos. La esterilizacin tambin puede realizarse mediante la colocacin de un pequeo resorte en cada trompa de [place], lo que hace que se desarrolle tejido cicatrizal en el interior de la trompa. Luego el tejido Verizoncicatrizal obstruye las trompas.   Comente el tema con su mdico para responder las inquietudes que usted o su pareja puedan Warehouse managertener. Podr preguntarle a su mdico qu tipo de Diplomatic Services operational officeresterilizacin se realizar. Algunos profesionales pueden no realizar todas las opciones. La esterilizacin es permanente y slo debe hacerse si est segura de que no desea tener hijos o no desea tener ms hijos. La reversin de la esterilizacin puede no tener xito.  PROCEDIMIENTOS PARA LA ESTERILIZACIN   Esterilizacin laparoscpica. Es un mtodo quirrgico que se Biomedical engineerrealiza en otro momento que no es inmediatamente despus del [place] Harborparto. Se realizan dos incisiones en la zona baja del abdomen. Un tubo delgado con una fuente de luz (laparoscopio) se inserta en una de las incisiones y se [place] ([place]) Islandsutiliza para Surveyor, quantityrealizar el procedimiento. Las trompas de  [place] se cierran con un anillo o un clip. Podrn utilizar un instrumento que utiliza el calor para sellar las trompas (electrocauterizacin).  Mini-laparotoma. Es un mtodo quirrgico que se Biomedical engineerrealiza 1 o 2 das despus del [place]arto. En general, consiste en una pequea incisin que se hace justo debajo del (ombligo) por el cual se visualizan las trompas de [place]alopio. Las trompas pueden ser selladas, atadas o cortadas.   Esterilizacin histeroscpica. Esto se realiza en otro momento que no sea inmediatamente despus del parto. Se coloca un pequeo resorte helicoidal a travs del cuello y del cuerpo del tero y se inserta en las trompas de ZeandaleFalopio. El resorte produce cicatrizacin y Thrivent Financialobstruye las trompas. Se deben utilizar otras formas de anticoncepcin durante 3 meses despus del procedimiento para permitir que el tejido cicatrizal se forme completamente. Adems, es necesario hacer una histerosalpingografa despus de 3 meses para asegurarse de que el procedimiento ha sido exitoso.  La histerosalpingografa es un procedimiento en el que utilizan rayos X para observar el tero y las trompas de [place] despus de insertar un dispositivo, para asegurarse de que est bien colocado. LA ESTERILIZACIN ES UN PROCEDIMIENTO SEGURO?  La esterilizacin se considera un procedimiento seguro en el que rara vez se producen complicaciones. Los riesgos dependen del tipo de procedimiento que se realicen. Al igual que con cualquier procedimiento quirrgico, puede haber riesgos. Algunos son:   Heron NaySangrado.  Infeccin.  Reaccin a la anestesia.  Lesin en los rganos circundantes. Los riesgos especficos de la colocacin de los resortes por histeroscopa son:   No se Production designer, theatre/television/filmpueden colocar correctamente la primera vez.   Las resortes pueden salirse del Environmental consultantlugar.  Las trompas no quedan completamente bloqueadas despus de 3 meses.   Ocurre una lesin en los rganos adyacentes al colocarlo.  LA ESTERILIZACIN ES UN MTODO  EFECTIVO?  La esterilizacin es efectiva en casi 100% , pero puede fallar. Segn el tipo de esterilizacin, el porcentaje de fracaso puede ser tan alto como en un 3%. Despus de la esterilizacin histeroscpica con colocacin de un resorte en las trompas de [place], Pension scheme manager un mtodo anticonceptivo de respaldo durante 3 meses. La esterilizacin es efectiva durante toda la vida.  LOS BENEFICIOS DE LA ESTERILIZACIN   No afecta las hormonas, y por lo tanto no afectar sus perodos menstruales, el deseo o el rendimiento sexual, .   Es efectiva para toda la vida.   Es segura.   No tiene que preocuparse por Location manager. Tenga en cuenta que si fue sometida a este procedimiento, debe esperar 3 meses (o hasta que el mdico lo confirme) antes de considerar que no quedar embarazada.   No hay efectos secundarios a diferencia de otros tipos de control de la natalidad (contracepcin).  INCONVENIENTES DE LA ESTERILIZACIN   Usted debe estar seguro de que no desea tener hijos o ms hijos. El procedimiento es [place].   No ofrece proteccin contra las infecciones de transmisin sexual (ITS).   Las trompas Hess Corporation a Engineer, building services. Si esto ocurre, habr riesgo de embarazo. Tambin hay un mayor riesgo (50%) que el embarazo sea ectpico. Se llama as al embarazo que ocurre fuera del tero.   Esta informacin no tiene Theme park manager el consejo del mdico. Asegrese de hacerle al mdico cualquier pregunta que tenga.   Document Released: 03/25/2008 Document Revised: 10/12/2013 Elsevier Interactive Patient Education [employer]

## 2016-05-27 NOTE — Progress Notes (Signed)
Subjective:  Valerie [name] is a 30 y.o. [phone] at 3016w4d being seen today for ongoing prenatal care.  She is currently monitored for the following issues for this low-risk pregnancy and has Asthma affecting pregnancy, antepartum; Supervision of low-risk pregnancy; History IUFD (intrauterine fetal death); UTI in pregnancy; History of 2 cesarean sections; and Sterilization consult on her problem list.  Patient reports no complaints.  Contractions: Not present. Vag. Bleeding: None.  Movement: Present. Denies leaking of fluid.   The following portions of the patient's history were reviewed and updated as appropriate: allergies, current medications, past family history, past medical history, past social history, past surgical history and problem list. Problem list updated.  Objective:   Vitals:   05/27/16 1535  BP: (!) 91/45  Pulse: 67  Weight: 212 lb 14.4 oz (96.6 kg)    Fetal Status: Fetal Heart Rate (bpm): 147   Movement: Present     General:  Alert, oriented and cooperative. Patient is in no acute distress.  Skin: Skin is warm and dry. No rash noted.   Cardiovascular: Normal heart rate noted  Respiratory: Normal respiratory effort, no problems with respiration noted  Abdomen: Soft, gravid, appropriate for gestational age. Pain/Pressure: Absent     Pelvic:  Cervical exam deferred        Extremities: Normal range of motion.  Edema: None  Mental Status: Normal mood and affect. Normal behavior. Normal judgment and thought content.   Urinalysis:      Assessment and Plan:  Pregnancy: [id number] at 416w4d  1. Supervision of low-risk pregnancy, second trimester  2. History of 2 cesarean sections Desires repeat with sterilzation Note sent to ofc to schedule  3. History IUFD (intrauterine fetal death) Antenatal testing at 32 weeks  4. Asthma affecting pregnancy, antepartum Stable  5. Sterilization consult No title XIX needed For BTL with sterilization  Preterm labor symptoms and  general obstetric precautions including but not limited to vaginal bleeding, contractions, leaking of fluid and fetal movement were reviewed in detail with the patient. Please refer to After Visit Summary for other counseling recommendations.  Return in about 4 weeks (around 06/24/2016).   Willodean Rosenthalarolyn Harraway-Smith, MD

## 2016-05-27 NOTE — Progress Notes (Signed)
Spanish video interpreter "Cristian" [phone]

## 2016-06-06 ENCOUNTER — Encounter: Payer: Self-pay | Admitting: Family Medicine

## 2016-06-14 ENCOUNTER — Encounter (HOSPITAL_COMMUNITY): Payer: Self-pay

## 2016-06-14 ENCOUNTER — Ambulatory Visit (HOSPITAL_COMMUNITY)
Admission: RE | Admit: 2016-06-14 | Discharge: 2016-06-14 | Disposition: A | Discharge status: Home / Self Care | Payer: BLUE CROSS/BLUE SHIELD | Source: Ambulatory Visit | Attending: Obstetrics and Gynecology | Admitting: Obstetrics and Gynecology

## 2016-06-14 DIAGNOSIS — Z3009 Encounter for other general counseling and advice on contraception: Secondary | ICD-10-CM

## 2016-06-14 DIAGNOSIS — IMO0002 Reserved for concepts with insufficient information to code with codable children: Secondary | ICD-10-CM

## 2016-06-14 DIAGNOSIS — O09293 Supervision of pregnancy with other poor reproductive or obstetric history, third trimester: Secondary | ICD-10-CM | POA: Diagnosis not present

## 2016-06-14 DIAGNOSIS — Z3A28 28 weeks gestation of pregnancy: Secondary | ICD-10-CM | POA: Insufficient documentation

## 2016-06-14 DIAGNOSIS — O34219 Maternal care for unspecified type scar from previous cesarean delivery: Secondary | ICD-10-CM | POA: Diagnosis not present

## 2016-06-17 ENCOUNTER — Other Ambulatory Visit (HOSPITAL_COMMUNITY): Payer: Self-pay | Admitting: *Deleted

## 2016-06-17 DIAGNOSIS — O09299 Supervision of pregnancy with other poor reproductive or obstetric history, unspecified trimester: Secondary | ICD-10-CM

## 2016-06-27 ENCOUNTER — Encounter (HOSPITAL_COMMUNITY): Payer: Self-pay | Admitting: *Deleted

## 2016-07-03 ENCOUNTER — Other Ambulatory Visit: Payer: Self-pay | Admitting: Obstetrics & Gynecology

## 2016-07-04 ENCOUNTER — Ambulatory Visit (INDEPENDENT_AMBULATORY_CARE_PROVIDER_SITE_OTHER): Payer: BLUE CROSS/BLUE SHIELD | Admitting: Family Medicine

## 2016-07-04 VITALS — BP 111/58 | HR 75 | Wt 217.5 lb

## 2016-07-04 DIAGNOSIS — J45909 Unspecified asthma, uncomplicated: Secondary | ICD-10-CM

## 2016-07-04 DIAGNOSIS — O9989 Other specified diseases and conditions complicating pregnancy, childbirth and the puerperium: Secondary | ICD-10-CM

## 2016-07-04 DIAGNOSIS — O26893 Other specified pregnancy related conditions, third trimester: Secondary | ICD-10-CM

## 2016-07-04 DIAGNOSIS — O99513 Diseases of the respiratory system complicating pregnancy, third trimester: Secondary | ICD-10-CM

## 2016-07-04 DIAGNOSIS — Z3493 Encounter for supervision of normal pregnancy, unspecified, third trimester: Secondary | ICD-10-CM

## 2016-07-04 DIAGNOSIS — Z98891 History of uterine scar from previous surgery: Secondary | ICD-10-CM

## 2016-07-04 DIAGNOSIS — N898 Other specified noninflammatory disorders of vagina: Secondary | ICD-10-CM

## 2016-07-04 DIAGNOSIS — Z23 Encounter for immunization: Secondary | ICD-10-CM | POA: Diagnosis not present

## 2016-07-04 DIAGNOSIS — O99519 Diseases of the respiratory system complicating pregnancy, unspecified trimester: Secondary | ICD-10-CM

## 2016-07-04 DIAGNOSIS — IMO0002 Reserved for concepts with insufficient information to code with codable children: Secondary | ICD-10-CM

## 2016-07-04 LAB — POCT URINALYSIS DIP (DEVICE)
BILIRUBIN URINE: NEGATIVE
Glucose, UA: NEGATIVE mg/dL
HGB URINE DIPSTICK: NEGATIVE
NITRITE: NEGATIVE
Protein, ur: 30 mg/dL — AB
Specific Gravity, Urine: 1.025 (ref 1.005–1.030)
Urobilinogen, UA: 0.2 mg/dL (ref 0.0–1.0)
pH: 6 (ref 5.0–8.0)

## 2016-07-04 MED ORDER — TETANUS-DIPHTH-ACELL PERTUSSIS 5-2.5-18.5 LF-MCG/0.5 IM SUSP
0.5000 mL | Freq: Once | INTRAMUSCULAR | Status: AC
  Administered 2016-07-04: 0.5 mL via INTRAMUSCULAR

## 2016-07-04 NOTE — Progress Notes (Signed)
Valerie [name] [id number] Flu/tdap vaccine today Pt will return on Monday for 28 week labs/1hr gtt

## 2016-07-04 NOTE — Progress Notes (Signed)
Subjective:  Valerie [name] is a 30 y.o. [id number] at 3464w0d being seen today for ongoing prenatal care.  She is currently monitored for the following issues for this high-risk pregnancy and has Asthma affecting pregnancy, antepartum; Supervision of low-risk pregnancy; History IUFD (intrauterine fetal death); UTI in pregnancy; History of 2 cesarean sections; and Sterilization consult on her problem list.  Patient reports no bleeding, no contractions, no cramping, no leaking and vaginal discharge.  Contractions: Not present. Vag. Bleeding: None.  Movement: Present. Denies leaking of fluid.   The following portions of the patient's history were reviewed and updated as appropriate: allergies, current medications, past family history, past medical history, past social history, past surgical history and problem list. Problem list updated.  Objective:   Vitals:   07/04/16 1559  BP: (!) 111/58  Pulse: 75  Weight: 217 lb 8 oz (98.7 kg)    Fetal Status: Fetal Heart Rate (bpm): 146   Movement: Present     General:  Alert, oriented and cooperative. Patient is in no acute distress.  Skin: Skin is warm and dry. No rash noted.   Cardiovascular: Normal heart rate noted  Respiratory: Normal respiratory effort, no problems with respiration noted  Abdomen: Soft, gravid, appropriate for gestational age. Pain/Pressure: Present     Pelvic:  Normal physiologic fluid, cervix visually closed, normal vaginal mucosa, no odor        Extremities: Normal range of motion.  Edema: None  Mental Status: Normal mood and affect. Normal behavior. Normal judgment and thought content.   Urinalysis: Urine Protein: 1+ Urine Glucose: Negative  Assessment and Plan:  Pregnancy: [id number] at 5264w0d  1. Supervision of low-risk pregnancy, third trimester - [place] - To return Monday for 2nd trim labs and GTT - NST starting at 32 weeks  2. Asthma affecting pregnancy, antepartum - Mild intermittent  3. History IUFD  (intrauterine fetal death) - NST starts next week  4. History of 2 cesarean sections - Desires BTL, has [employer] insurance, no paperwork needed.  5. Needs flu shot - Flu Vaccine QUAD 36+ mos IM (Fluarix, Quad PF)  6. Need for Tdap vaccination - Tdap (BOOSTRIX) injection 0.5 mL; Inject 0.5 mLs into the muscle once.  Preterm labor symptoms and general obstetric precautions including but not limited to vaginal bleeding, contractions, leaking of fluid and fetal movement were reviewed in detail with the patient. Please refer to After Visit Summary for other counseling recommendations.  Return in about 1 week (around 07/11/2016) for NST (twice weekly); OB visit in 2 weeks.   [facility] LLCElizabeth Woodland Bishop Hills[place]um[place], [place]

## 2016-07-04 NOTE — Patient Instructions (Signed)

## 2016-07-05 ENCOUNTER — Other Ambulatory Visit: Payer: Self-pay | Admitting: Family Medicine

## 2016-07-05 LAB — WET PREP, GENITAL: Trich, Wet Prep: NONE SEEN

## 2016-07-05 MED ORDER — FLUCONAZOLE 150 MG PO TABS: 150.0000 mg | ORAL_TABLET | Freq: Once | ORAL | 0 refills | Status: AC

## 2016-07-05 MED ORDER — METRONIDAZOLE 500 MG PO TABS: 500.0000 mg | ORAL_TABLET | Freq: Two times a day (BID) | ORAL | 0 refills | Status: AC

## 2016-07-08 ENCOUNTER — Other Ambulatory Visit: Payer: BLUE CROSS/BLUE SHIELD

## 2016-07-08 DIAGNOSIS — Z3493 Encounter for supervision of normal pregnancy, unspecified, third trimester: Secondary | ICD-10-CM

## 2016-07-09 LAB — CBC
HCT: 33 % — ABNORMAL LOW (ref 35.0–45.0)
HEMOGLOBIN: 10.5 g/dL — AB (ref 11.7–15.5)
MCH: 25.2 pg — ABNORMAL LOW (ref 27.0–33.0)
MCHC: 31.8 g/dL — AB (ref 32.0–36.0)
MCV: 79.1 fL — ABNORMAL LOW (ref 80.0–100.0)
MPV: 10.2 fL (ref 7.5–12.5)
Platelets: 279 10*3/uL (ref 140–400)
RBC: 4.17 MIL/uL (ref 3.80–5.10)
RDW: 16 % — ABNORMAL HIGH (ref 11.0–15.0)
WBC: 8.6 10*3/uL (ref 3.8–10.8)

## 2016-07-10 LAB — RPR

## 2016-07-10 LAB — HIV ANTIBODY (ROUTINE TESTING W REFLEX): HIV 1&2 Ab, 4th Generation: NONREACTIVE

## 2016-07-10 LAB — GLUCOSE TOLERANCE, 1 HOUR (50G) W/O FASTING: Glucose, 1 Hr, gestational: 105 mg/dL (ref <140)

## 2016-07-12 ENCOUNTER — Encounter (HOSPITAL_COMMUNITY): Payer: Self-pay

## 2016-07-12 ENCOUNTER — Other Ambulatory Visit (HOSPITAL_COMMUNITY): Payer: Self-pay | Admitting: Maternal and Fetal Medicine

## 2016-07-12 ENCOUNTER — Ambulatory Visit (HOSPITAL_COMMUNITY)
Admission: RE | Admit: 2016-07-12 | Discharge: 2016-07-12 | Disposition: A | Discharge status: Home / Self Care | Payer: BLUE CROSS/BLUE SHIELD | Source: Ambulatory Visit | Attending: Obstetrics and Gynecology | Admitting: Obstetrics and Gynecology

## 2016-07-12 DIAGNOSIS — O34219 Maternal care for unspecified type scar from previous cesarean delivery: Secondary | ICD-10-CM | POA: Insufficient documentation

## 2016-07-12 DIAGNOSIS — O99513 Diseases of the respiratory system complicating pregnancy, third trimester: Secondary | ICD-10-CM | POA: Diagnosis not present

## 2016-07-12 DIAGNOSIS — O09293 Supervision of pregnancy with other poor reproductive or obstetric history, third trimester: Secondary | ICD-10-CM

## 2016-07-12 DIAGNOSIS — O99519 Diseases of the respiratory system complicating pregnancy, unspecified trimester: Secondary | ICD-10-CM

## 2016-07-12 DIAGNOSIS — Z3A32 32 weeks gestation of pregnancy: Secondary | ICD-10-CM | POA: Insufficient documentation

## 2016-07-12 DIAGNOSIS — J45909 Unspecified asthma, uncomplicated: Secondary | ICD-10-CM

## 2016-07-12 DIAGNOSIS — O09299 Supervision of pregnancy with other poor reproductive or obstetric history, unspecified trimester: Secondary | ICD-10-CM

## 2016-07-12 DIAGNOSIS — Z3009 Encounter for other general counseling and advice on contraception: Secondary | ICD-10-CM

## 2016-07-24 ENCOUNTER — Telehealth: Payer: Self-pay | Admitting: *Deleted

## 2016-07-24 NOTE — Telephone Encounter (Signed)
Called pt w/[employer] interpreter # [phone] and informed of test results showing +BV and yeast. Prescriptions have been sent to her pharmacy and she may pick up the medications today. Per Dr. Omer JackMumaw, pt was advised to complete the Flagyl first, then take the Diflucan. Pt voiced understanding.

## 2016-07-25 ENCOUNTER — Ambulatory Visit (INDEPENDENT_AMBULATORY_CARE_PROVIDER_SITE_OTHER): Payer: BLUE CROSS/BLUE SHIELD | Admitting: Advanced Practice Midwife

## 2016-07-25 ENCOUNTER — Encounter: Payer: Self-pay | Admitting: Advanced Practice Midwife

## 2016-07-25 VITALS — BP 111/59 | HR 70 | Wt 215.0 lb

## 2016-07-25 DIAGNOSIS — Z3493 Encounter for supervision of normal pregnancy, unspecified, third trimester: Secondary | ICD-10-CM

## 2016-07-25 NOTE — Progress Notes (Signed)
   PRENATAL VISIT NOTE  Subjective:  Valerie [name] is a 30 y.o. [phone] at 8250w0d being seen today for ongoing prenatal care.  She is currently monitored for the following issues for this low-risk pregnancy and has Asthma affecting pregnancy, antepartum; Supervision of low-risk pregnancy; History IUFD (intrauterine fetal death); UTI in pregnancy; History of 2 cesarean sections; and Sterilization consult on her problem list.  Patient reports no complaints.  Contractions: Not present. Vag. Bleeding: None.  Movement: Present. Denies leaking of fluid.   The following portions of the patient's history were reviewed and updated as appropriate: allergies, current medications, past family history, past medical history, past social history, past surgical history and problem list. Problem list updated.  Objective:   Vitals:   07/25/16 1624  BP: (!) 111/59  Pulse: 70  Weight: 215 lb (97.5 kg)    Fetal Status: Fetal Heart Rate (bpm): 138   Movement: Present     General:  Alert, oriented and cooperative. Patient is in no acute distress.  Skin: Skin is warm and dry. No rash noted.   Cardiovascular: Normal heart rate noted  Respiratory: Normal respiratory effort, no problems with respiration noted  Abdomen: Soft, gravid, appropriate for gestational age. Pain/Pressure: Absent     Pelvic:  Cervical exam deferred        Extremities: Normal range of motion.  Edema: None  Mental Status: Normal mood and affect. Normal behavior. Normal judgment and thought content.   Urinalysis:      Assessment and Plan:  Pregnancy: [id number] at 2150w0d  1. Supervision of low-risk pregnancy, third trimester     Feels well, no UCs, good FM  Preterm labor symptoms and general obstetric precautions including but not limited to vaginal bleeding, contractions, leaking of fluid and fetal movement were reviewed in detail with the patient. Please refer to After Visit Summary for other counseling recommendations.  Return in  about 2 weeks (around 08/08/2016) for [facility].  Aviva SignsMarie L Williams, CNM

## 2016-07-25 NOTE — Patient Instructions (Signed)
Tercer trimestre de embarazo (Third Trimester of Pregnancy) El tercer trimestre comprende desde la semana29 hasta la semana42, es decir, desde el mes7 hasta el mes9. El tercer trimestre es un perodo en el que el feto crece rpidamente. Hacia el final del noveno mes, el feto mide alrededor de 20pulgadas (45cm) de largo y pesa entre 6 y 10 libras (2,700 y 4,500kg).  CAMBIOS EN EL ORGANISMO Su organismo atraviesa por muchos cambios durante el embarazo, y estos varan de una mujer a otra.   Seguir aumentando de peso. Es de esperar que aumente entre 25 y 35libras (11 y 16kg) hacia el final del embarazo.  Podrn aparecer las primeras estras en las caderas, el abdomen y las mamas.  Puede tener necesidad de orinar con ms frecuencia porque el feto baja hacia la pelvis y ejerce presin sobre la vejiga.  Debido al embarazo podr sentir acidez estomacal con frecuencia.  Puede estar estreida, ya que ciertas hormonas enlentecen los movimientos de los msculos que empujan los desechos a travs de los intestinos.  Pueden aparecer hemorroides o abultarse e hincharse las venas (venas varicosas).  Puede sentir dolor plvico debido al aumento de peso y a que las hormonas del embarazo relajan las articulaciones entre los huesos de la pelvis. El dolor de espalda puede ser consecuencia de la sobrecarga de los msculos que soportan la postura.  Tal vez haya cambios en el cabello que pueden incluir su engrosamiento, crecimiento rpido y cambios en la textura. Adems, a algunas mujeres se les cae el cabello durante o despus del embarazo, o tienen el cabello seco o fino. Lo ms probable es que el cabello se le normalice despus del nacimiento del beb.  Las mamas seguirn creciendo y le dolern. A veces, puede haber una secrecin amarilla de las mamas llamada calostro.  El ombligo puede salir hacia afuera.  Puede sentir que le falta el aire debido a que se expande el tero.  Puede notar que el feto  "baja" o lo siente ms bajo, en el abdomen.  Puede tener una prdida de secrecin mucosa con sangre. Esto suele ocurrir en el trmino de unos pocos das a una semana antes de que comience el trabajo de parto.  El cuello del tero se vuelve delgado y blando (se borra) cerca de la fecha de parto. QU DEBE ESPERAR EN LOS EXMENES PRENATALES  Le harn exmenes prenatales cada 2semanas hasta la semana36. A partir de ese momento le harn exmenes semanales. Durante una visita prenatal de rutina:  La pesarn para asegurarse de que usted y el feto estn creciendo normalmente.  Le tomarn la presin arterial.  Le medirn el abdomen para controlar el desarrollo del beb.  Se escucharn los latidos cardacos fetales.  Se evaluarn los resultados de los estudios solicitados en visitas anteriores.  Le revisarn el cuello del tero cuando est prxima la fecha de parto para controlar si este se ha borrado. Alrededor de la semana36, el mdico le revisar el cuello del tero. Al mismo tiempo, realizar un anlisis de las secreciones del tejido vaginal. Este examen es para determinar si hay un tipo de bacteria, estreptococo Grupo B. El mdico le explicar esto con ms detalle. El mdico puede preguntarle lo siguiente:  Cmo le gustara que fuera el parto.  Cmo se siente.  Si siente los movimientos del beb.  Si ha tenido sntomas anormales, como prdida de lquido, sangrado, dolores de cabeza intensos o clicos abdominales.  Si est consumiendo algn producto que contenga tabaco, como cigarrillos, tabaco   de mascar y cigarrillos electrnicos.  Si tiene alguna pregunta. Otros exmenes o estudios de deteccin que pueden realizarse durante el tercer trimestre incluyen lo siguiente:  Anlisis de sangre para controlar los niveles de hierro (anemia).  Controles fetales para determinar su salud, nivel de actividad y crecimiento. Si tiene alguna enfermedad o hay problemas durante el embarazo, le harn  estudios.  Prueba del VIH (virus de inmunodeficiencia humana). Si corre un riesgo alto, pueden realizarle una prueba de deteccin del VIH durante el tercer trimestre del embarazo. FALSO TRABAJO DE PARTO Es posible que sienta contracciones leves e irregulares que finalmente desaparecen. Se llaman contracciones de Braxton Hicks o falso trabajo de parto. Las contracciones pueden durar horas, das o incluso semanas, antes de que el verdadero trabajo de parto se inicie. Si las contracciones ocurren a intervalos regulares, se intensifican o se hacen dolorosas, lo mejor es que la revise el mdico.  SIGNOS DE TRABAJO DE PARTO   Clicos de tipo menstrual.  Contracciones cada 5minutos o menos.  Contracciones que comienzan en la parte superior del tero y se extienden hacia abajo, a la zona inferior del abdomen y la espalda.  Sensacin de mayor presin en la pelvis o dolor de espalda.  Una secrecin de mucosidad acuosa o con sangre que sale de la vagina. Si tiene alguno de estos signos antes de la semana37 del embarazo, llame a su mdico de inmediato. Debe concurrir al hospital para que la controlen inmediatamente. INSTRUCCIONES PARA EL CUIDADO EN EL HOGAR   Evite fumar, consumir hierbas, beber alcohol y tomar frmacos que no le hayan recetado. Estas sustancias qumicas afectan la formacin y el desarrollo del beb.  No consuma ningn producto que contenga tabaco, lo que incluye cigarrillos, tabaco de mascar y cigarrillos electrnicos. Si necesita ayuda para dejar de fumar, consulte al mdico. Puede recibir asesoramiento y otro tipo de recursos para dejar de fumar.  Siga las indicaciones del mdico en relacin con el uso de medicamentos. Durante el embarazo, hay medicamentos que son seguros de tomar y otros que no.  Haga ejercicio solamente como se lo haya indicado el mdico. Sentir clicos uterinos es un buen signo para detener la actividad fsica.  Contine comiendo alimentos sanos con  regularidad.  Use un sostn que le brinde buen soporte si le duelen las mamas.  No se d baos de inmersin en agua caliente, baos turcos ni saunas.  Use el cinturn de seguridad en todo momento mientras conduce.  No coma carne cruda ni queso sin cocinar; evite el contacto con las bandejas sanitarias de los gatos y la tierra que estos animales usan. Estos elementos contienen grmenes que pueden causar defectos congnitos en el beb.  Tome las vitaminas prenatales.  Tome entre 1500 y 2000mg de calcio diariamente comenzando en la semana20 del embarazo hasta el parto.  Si est estreida, pruebe un laxante suave (si el mdico lo autoriza). Consuma ms alimentos ricos en fibra, como vegetales y frutas frescos y cereales integrales. Beba gran cantidad de lquido para mantener la orina de tono claro o color amarillo plido.  Dese baos de asiento con agua tibia para aliviar el dolor o las molestias causadas por las hemorroides. Use una crema para las hemorroides si el mdico la autoriza.  Si tiene venas varicosas, use medias de descanso. Eleve los pies durante 15minutos, 3 o 4veces por da. Limite el consumo de sal en su dieta.  Evite levantar objetos pesados, use zapatos de tacones bajos y mantenga una buena postura.  Descanse   con las piernas elevadas si tiene calambres o dolor de cintura.  Visite a su dentista si no lo ha hecho durante el embarazo. Use un cepillo de dientes blando para higienizarse los dientes y psese el hilo dental con suavidad.  Puede seguir manteniendo relaciones sexuales, a menos que el mdico le indique lo contrario.  No haga viajes largos excepto que sea absolutamente necesario y solo con la autorizacin del mdico.  Tome clases prenatales para entender, practicar y hacer preguntas sobre el trabajo de parto y el parto.  Haga un ensayo de la partida al hospital.  Prepare el bolso que llevar al hospital.  Prepare la habitacin del beb.  Concurra a todas  las visitas prenatales segn las indicaciones de su mdico. SOLICITE ATENCIN MDICA SI:  No est segura de que est en trabajo de parto o de que ha roto la bolsa de las aguas.  Tiene mareos.  Siente clicos leves, presin en la pelvis o dolor persistente en el abdomen.  Tiene nuseas, vmitos o diarrea persistentes.  Observa una secrecin vaginal con mal olor.  Siente dolor al orinar. SOLICITE ATENCIN MDICA DE INMEDIATO SI:   Tiene fiebre.  Tiene una prdida de lquido por la vagina.  Tiene sangrado o pequeas prdidas vaginales.  Siente dolor intenso o clicos en el abdomen.  Sube o baja de peso rpidamente.  Tiene dificultad para respirar y siente dolor de pecho.  Sbitamente se le hinchan mucho el rostro, las manos, los tobillos, los pies o las piernas.  No ha sentido los movimientos del beb durante una hora.  Siente un dolor de cabeza intenso que no se alivia con medicamentos.  Su visin se modifica.   Esta informacin no tiene como fin reemplazar el consejo del mdico. Asegrese de hacerle al mdico cualquier pregunta que tenga.   Document Released: 07/17/2005 Document Revised: 10/28/2014 Elsevier Interactive Patient Education 2016 Elsevier Inc.  

## 2016-07-25 NOTE — Progress Notes (Signed)
Spanish video interpreter "Jamesetta Orleansricka" [phone] used

## 2016-08-08 ENCOUNTER — Ambulatory Visit (INDEPENDENT_AMBULATORY_CARE_PROVIDER_SITE_OTHER): Payer: BLUE CROSS/BLUE SHIELD | Admitting: Family Medicine

## 2016-08-08 VITALS — BP 107/57 | HR 92 | Temp 98.4°F | Wt 217.7 lb

## 2016-08-08 DIAGNOSIS — Z3493 Encounter for supervision of normal pregnancy, unspecified, third trimester: Secondary | ICD-10-CM | POA: Diagnosis not present

## 2016-08-08 DIAGNOSIS — O0990 Supervision of high risk pregnancy, unspecified, unspecified trimester: Secondary | ICD-10-CM

## 2016-08-08 DIAGNOSIS — O09299 Supervision of pregnancy with other poor reproductive or obstetric history, unspecified trimester: Secondary | ICD-10-CM

## 2016-08-08 DIAGNOSIS — Z113 Encounter for screening for infections with a predominantly sexual mode of transmission: Secondary | ICD-10-CM

## 2016-08-08 DIAGNOSIS — Z8759 Personal history of other complications of pregnancy, childbirth and the puerperium: Secondary | ICD-10-CM | POA: Diagnosis not present

## 2016-08-08 DIAGNOSIS — Z98891 History of uterine scar from previous surgery: Secondary | ICD-10-CM

## 2016-08-08 DIAGNOSIS — O09293 Supervision of pregnancy with other poor reproductive or obstetric history, third trimester: Secondary | ICD-10-CM

## 2016-08-08 NOTE — Progress Notes (Signed)
[employer] Spanish video interpreter "Melony" [phone] used

## 2016-08-08 NOTE — Progress Notes (Signed)
   PRENATAL VISIT NOTE  Subjective:  Valerie [name] is a 30 y.o. [phone] at 2843w0d being seen today for ongoing prenatal care.  She is currently monitored for the following issues for this high-risk pregnancy and has Asthma affecting pregnancy, antepartum; Supervision of high risk pregnancy, antepartum; Previous stillbirth or demise, antepartum; UTI in pregnancy; History of 2 cesarean sections; and Sterilization consult on her problem list.  Patient reports no complaints.  Contractions: Not present. Vag. Bleeding: None.  Movement: Present. Denies leaking of fluid.   The following portions of the patient's history were reviewed and updated as appropriate: allergies, current medications, past family history, past medical history, past social history, past surgical history and problem list. Problem list updated.  Objective:   Vitals:   08/08/16 1525  BP: (!) 107/57  Pulse: 92  Temp: 98.4 F (36.9 C)  Weight: 217 lb 11.2 oz (98.7 kg)    Fetal Status: Fetal Heart Rate (bpm): 156 Fundal Height: 35 cm Movement: Present     General:  Alert, oriented and cooperative. Patient is in no acute distress.  Skin: Skin is warm and dry. No rash noted.   Cardiovascular: Normal heart rate noted  Respiratory: Normal respiratory effort, no problems with respiration noted  Abdomen: Soft, gravid, appropriate for gestational age. Pain/Pressure: Absent     Pelvic:  Cervical exam deferred        Extremities: Normal range of motion.  Edema: None  Mental Status: Normal mood and affect. Normal behavior. Normal judgment and thought content.   Assessment and Plan:  Pregnancy: [id number] at 4243w0d  1. Supervision of high risk pregnancy, antepartum - Culture, beta strep (group b only) - GC/Chlamydia probe amp ([place])not at [facility]  2. Previous stillbirth or demise, antepartum Begin 2x/wk testing, has u/s for growth scheduled 11/2 - Fetal nonstress test  3. History of 2 cesarean sections Scheduled for  ERLTCS and BTL 11/9   Preterm labor symptoms and general obstetric precautions including but not limited to vaginal bleeding, contractions, leaking of fluid and fetal movement were reviewed in detail with the patient. Please refer to After Visit Summary for other counseling recommendations.  Return in 1 week (on 08/15/2016) for NST only in 4 days, HRC, OB visit and NST.  Reva Boresanya S Rona Tomson, MD

## 2016-08-08 NOTE — Patient Instructions (Signed)
Tercer trimestre de embarazo (Third Trimester of Pregnancy) El tercer trimestre comprende desde la semana29 hasta la semana42, es decir, desde el mes7 hasta el mes9. El tercer trimestre es un perodo en el que el feto crece rpidamente. Hacia el final del noveno mes, el feto mide alrededor de 20pulgadas (45cm) de largo y pesa entre 6 y 10 libras (2,700 y 4,500kg).  CAMBIOS EN EL ORGANISMO Su organismo atraviesa por muchos cambios durante el embarazo, y estos varan de una mujer a otra.   Seguir aumentando de peso. Es de esperar que aumente entre 25 y 35libras (11 y 16kg) hacia el final del embarazo.  Podrn aparecer las primeras estras en las caderas, el abdomen y las mamas.  Puede tener necesidad de orinar con ms frecuencia porque el feto baja hacia la pelvis y ejerce presin sobre la vejiga.  Debido al embarazo podr sentir acidez estomacal con frecuencia.  Puede estar estreida, ya que ciertas hormonas enlentecen los movimientos de los msculos que empujan los desechos a travs de los intestinos.  Pueden aparecer hemorroides o abultarse e hincharse las venas (venas varicosas).  Puede sentir dolor plvico debido al aumento de peso y a que las hormonas del embarazo relajan las articulaciones entre los huesos de la pelvis. El dolor de espalda puede ser consecuencia de la sobrecarga de los msculos que soportan la postura.  Tal vez haya cambios en el cabello que pueden incluir su engrosamiento, crecimiento rpido y cambios en la textura. Adems, a algunas mujeres se les cae el cabello durante o despus del embarazo, o tienen el cabello seco o fino. Lo ms probable es que el cabello se le normalice despus del nacimiento del beb.  Las mamas seguirn creciendo y le dolern. A veces, puede haber una secrecin amarilla de las mamas llamada calostro.  El ombligo puede salir hacia afuera.  Puede sentir que le falta el aire debido a que se expande el tero.  Puede notar que el feto  "baja" o lo siente ms bajo, en el abdomen.  Puede tener una prdida de secrecin mucosa con sangre. Esto suele ocurrir en el trmino de unos pocos das a una semana antes de que comience el trabajo de parto.  El cuello del tero se vuelve delgado y blando (se borra) cerca de la fecha de parto. QU DEBE ESPERAR EN LOS EXMENES PRENATALES  Le harn exmenes prenatales cada 2semanas hasta la semana36. A partir de ese momento le harn exmenes semanales. Durante una visita prenatal de rutina:  La pesarn para asegurarse de que usted y el feto estn creciendo normalmente.  Le tomarn la presin arterial.  Le medirn el abdomen para controlar el desarrollo del beb.  Se escucharn los latidos cardacos fetales.  Se evaluarn los resultados de los estudios solicitados en visitas anteriores.  Le revisarn el cuello del tero cuando est prxima la fecha de parto para controlar si este se ha borrado. Alrededor de la semana36, el mdico le revisar el cuello del tero. Al mismo tiempo, realizar un anlisis de las secreciones del tejido vaginal. Este examen es para determinar si hay un tipo de bacteria, estreptococo Grupo B. El mdico le explicar esto con ms detalle. El mdico puede preguntarle lo siguiente:  Cmo le gustara que fuera el parto.  Cmo se siente.  Si siente los movimientos del beb.  Si ha tenido sntomas anormales, como prdida de lquido, sangrado, dolores de cabeza intensos o clicos abdominales.  Si est consumiendo algn producto que contenga tabaco, como cigarrillos, tabaco   de mascar y cigarrillos electrnicos.  Si tiene alguna pregunta. Otros exmenes o estudios de deteccin que pueden realizarse durante el tercer trimestre incluyen lo siguiente:  Anlisis de sangre para controlar los niveles de hierro (anemia).  Controles fetales para determinar su salud, nivel de actividad y crecimiento. Si tiene alguna enfermedad o hay problemas durante el embarazo, le harn  estudios.  Prueba del VIH (virus de inmunodeficiencia humana). Si corre un riesgo alto, pueden realizarle una prueba de deteccin del VIH durante el tercer trimestre del embarazo. FALSO TRABAJO DE PARTO Es posible que sienta contracciones leves e irregulares que finalmente desaparecen. Se llaman contracciones de Braxton Hicks o falso trabajo de parto. Las contracciones pueden durar horas, das o incluso semanas, antes de que el verdadero trabajo de parto se inicie. Si las contracciones ocurren a intervalos regulares, se intensifican o se hacen dolorosas, lo mejor es que la revise el mdico.  SIGNOS DE TRABAJO DE PARTO   Clicos de tipo menstrual.  Contracciones cada 5minutos o menos.  Contracciones que comienzan en la parte superior del tero y se extienden hacia abajo, a la zona inferior del abdomen y la espalda.  Sensacin de mayor presin en la pelvis o dolor de espalda.  Una secrecin de mucosidad acuosa o con sangre que sale de la vagina. Si tiene alguno de estos signos antes de la semana37 del embarazo, llame a su mdico de inmediato. Debe concurrir al hospital para que la controlen inmediatamente. INSTRUCCIONES PARA EL CUIDADO EN EL HOGAR   Evite fumar, consumir hierbas, beber alcohol y tomar frmacos que no le hayan recetado. Estas sustancias qumicas afectan la formacin y el desarrollo del beb.  No consuma ningn producto que contenga tabaco, lo que incluye cigarrillos, tabaco de mascar y cigarrillos electrnicos. Si necesita ayuda para dejar de fumar, consulte al mdico. Puede recibir asesoramiento y otro tipo de recursos para dejar de fumar.  Siga las indicaciones del mdico en relacin con el uso de medicamentos. Durante el embarazo, hay medicamentos que son seguros de tomar y otros que no.  Haga ejercicio solamente como se lo haya indicado el mdico. Sentir clicos uterinos es un buen signo para detener la actividad fsica.  Contine comiendo alimentos sanos con  regularidad.  Use un sostn que le brinde buen soporte si le duelen las mamas.  No se d baos de inmersin en agua caliente, baos turcos ni saunas.  Use el cinturn de seguridad en todo momento mientras conduce.  No coma carne cruda ni queso sin cocinar; evite el contacto con las bandejas sanitarias de los gatos y la tierra que estos animales usan. Estos elementos contienen grmenes que pueden causar defectos congnitos en el beb.  Tome las vitaminas prenatales.  Tome entre 1500 y 2000mg de calcio diariamente comenzando en la semana20 del embarazo hasta el parto.  Si est estreida, pruebe un laxante suave (si el mdico lo autoriza). Consuma ms alimentos ricos en fibra, como vegetales y frutas frescos y cereales integrales. Beba gran cantidad de lquido para mantener la orina de tono claro o color amarillo plido.  Dese baos de asiento con agua tibia para aliviar el dolor o las molestias causadas por las hemorroides. Use una crema para las hemorroides si el mdico la autoriza.  Si tiene venas varicosas, use medias de descanso. Eleve los pies durante 15minutos, 3 o 4veces por da. Limite el consumo de sal en su dieta.  Evite levantar objetos pesados, use zapatos de tacones bajos y mantenga una buena postura.  Descanse   con las piernas elevadas si tiene calambres o dolor de cintura.  Visite a su dentista si no lo ha Occupational hygienisthecho durante el embarazo. Use un cepillo de dientes blando para higienizarse los dientes y psese el hilo dental con suavidad.  Puede seguir Calpine Corporationmanteniendo relaciones sexuales, a menos que el mdico le indique lo contrario.  No haga viajes largos excepto que sea absolutamente necesario y solo con la autorizacin del mdico.  Tome clases prenatales para Financial traderentender, Education administratorpracticar y hacer preguntas sobre el Ben Avontrabajo de parto y [place]el parto.  Haga un ensayo de la partida al hospital.  Prepare el bolso que llevar al hospital.  Prepare la habitacin del beb.  Concurra a todas  las visitas prenatales segn las indicaciones de su mdico. SOLICITE ATENCIN MDICA SI:  No est segura de que est en trabajo de parto o de que ha roto la bolsa de las aguas.  Tiene mareos.  Siente clicos leves, presin en la pelvis o dolor persistente en el abdomen.  Tiene nuseas, vmitos o diarrea persistentes.  Brett Fairybserva una secrecin vaginal con mal olor.  Siente dolor al ConocoPhillipsorinar. SOLICITE ATENCIN MDICA DE INMEDIATO SI:   Tiene fiebre.  Tiene una prdida de lquido por la vagina.  Tiene sangrado o pequeas prdidas vaginales.  Siente dolor intenso o clicos en el abdomen.  Sube o baja de peso rpidamente.  Tiene dificultad para respirar y siente dolor de pecho.  Sbitamente se le hinchan mucho el rostro, las Red Budmanos, los tobillos, los pies o las piernas.  No ha sentido los movimientos del beb durante Georgianne Fickuna hora.  Siente un dolor de cabeza intenso que no se alivia con medicamentos.  Su visin se modifica.   Esta informacin no tiene Theme park managercomo fin reemplazar el consejo del mdico. Asegrese de hacerle al mdico cualquier pregunta que tenga.   Document Released: 07/17/2005 Document Revised: 10/28/2014 Elsevier Interactive Patient Education 2016 [employer]  Parto vaginal despus de Neomia Dearuna cesrea (Vaginal Birth After Cesarean Delivery) Un parto vaginal despus de un parto por cesrea es dar a luz por la vagina despus de haber dado a luz por medio de una intervencin Barbadosquirrgica. En el pasado, si una mujer tena un beb por cesrea, todos los partos posteriores deban hacerse por cesrea. Esto ya no es as. Puede ser seguro para la mam intentar un parto vaginal luego de una cesrea.  Es importante que converse con su mdico desde comienzos del Psychiatristembarazo de modo que pueda Googlecomprender los riesgos, beneficios y opciones. Le dar tiempo para decidir qu es lo mejor en su caso particular. La decisin final de tener un parto vaginal o por cesrea debe tomarse en conjunto, entre usted  y el mdico. Cualquier cambio en su salud o la de su beb durante el embarazo puede ser motivo de un cambio de decisin respecto del parto vaginal.  LAS MUJERES QUE OPTAN POR EL PARTO VAGINAL, DEBEN CONSULTAR AL MDICO PARA ASEGURARSE DE QUE:  La cesrea anterior se haya realizado con un corte (incisin) uterino transversal (no con una incisin vertical clsica).  El canal de parto es lo suficientemente grande como para que pase el Gri[place] Flatsnio.  No ha sido sometida a otras operaciones del tero.  Durante el trabajo de parto, le realizarn un monitoreo fetal Forensic scientistelectrnico, en todo momento.  Habr un quirfano disponible y listo en caso de necesitar una cesrea de emergencia.  Un mdico y personal de quirfano estarn disponibles en todo momento durante el [place] Mitchelltrabajo de parto, para realizar una cesrea en caso de ser  necesario.  Habr un anestesista disponible en caso de necesitar una cesrea de emergencia.  La nursery est lista y cuenta con personal especializado y el equipo disponible para cuidar al beb en caso de emergencia. BENEFICIOS DEL PARTO VAGINAL:  Permanencia ms breve en el hospital.  Prevencin de los riesgos asociados con el parto por cesrea, por ejemplo:  Complicaciones quirrgicas, como apertura o hernia de la incisin.  Lesiones en otros rganos.  Grant Ruts. Esto puede ocurrir si aparece una infeccin despus de la ciruga. Tambin puede ocurrir como reaccin a los medicamentos administrados para adormecerla durante la [place].  Menos prdida de sangre y menos probabilidad de necesitar una transfusin sangunea.  Menor riesgo de cogulos sanguneos e infeccin.  Tiempo ms corto de recuperacin.  Menor riesgo de remocin del tero (histerectoma).  Menor riesgo de que la placenta cubra parcial o completamente la abertura del tero (placenta previa) en embarazos futuros.  Menos riesgos en el [place] de parto y [place] futuros. RIESGOS  Ruptura del tero. Esto ocurre en  menos del 1% de los partos vaginales. El riesgo de que eso suceda es mayor si:  Se toman medidas para iniciar el proceso del [place] de parto (inducir Engineer, manufacturing systems) o Risk manager o intensificar las contracciones (aumentar el trabajo de [place]).  Se usan medicamentos para ablandar (madurar) el cuello del tero.  Es necesario extraer el tero (histerectoma) si se rompe. No debe llevarse a cabo si:  La cesrea previa se realiz con una incisin vertical (clsica) o con forma de T, o usted no sabe cul de [employer] han practicado.  Ha sufrido ruptura del tero.  Ha tenido ciertos tipos de Leisure centre manager en el tero, como la extirpacin de fibromas uterinos. Pregntele a su mdico sobre otros tipos de cirugas que le impiden tener un parto vaginal.  Tiene ciertos problemas mdicos o relacionados con el parto (obsttricos).  El beb est en problemas.  Tuvo dos cesreas previas y ningn parto vaginal. OTRAS COSAS QUE DEBE SABER:  La anestesia peridural es segura.  Es seguro dar vuelta al beb si se encuentra de nalgas (intentar una versin ceflica externa).  Es seguro intentarlo en caso de mellizos.  El parto vaginal puede no ser apropiado si el beb pesa 8,8lb (4kg) o ms. Sin embargo, las predicciones de Hoffman no son siempre exactas y no deben ser lo nico a tenerse en cuenta para decidir si el parto vaginal es lo indicado para usted.  Hay aumento en el porcentaje de fracasos si el intervalo entre la cesrea y el parto vaginal es de menos de 19 meses.  Su mdico puede aconsejarle no tener un parto vaginal si tiene preeclampsia (hipertensin, protena en la orina e hinchazn en la cara y las extremidades).  El parto vaginal suele ser exitoso si ya tuvo un parto vaginal previamente.  Tambin suele ser exitoso cuando el trabajo de parto comienza espontneamente antes de la fecha.  El parto vaginal despus de Eustace Quail es similar a un parto espontneo vaginal normal.   Esta informacin no  tiene Theme park manager el consejo del mdico. Asegrese de hacerle al mdico cualquier pregunta que tenga.   Document Released: 03/25/2008 Document Revised: 07/28/2013 Elsevier Interactive Patient Education [employer]

## 2016-08-09 ENCOUNTER — Ambulatory Visit (HOSPITAL_COMMUNITY): Payer: BLUE CROSS/BLUE SHIELD

## 2016-08-10 LAB — CULTURE, BETA STREP (GROUP B ONLY)

## 2016-08-12 ENCOUNTER — Encounter: Payer: Self-pay | Admitting: Family Medicine

## 2016-08-12 LAB — GC/CHLAMYDIA PROBE AMP (~~LOC~~) NOT AT ARMC
Chlamydia: NEGATIVE
NEISSERIA GONORRHEA: NEGATIVE

## 2016-08-15 ENCOUNTER — Ambulatory Visit (INDEPENDENT_AMBULATORY_CARE_PROVIDER_SITE_OTHER): Payer: BLUE CROSS/BLUE SHIELD | Admitting: *Deleted

## 2016-08-15 ENCOUNTER — Encounter: Payer: BLUE CROSS/BLUE SHIELD | Admitting: Obstetrics and Gynecology

## 2016-08-15 VITALS — BP 94/60 | HR 84

## 2016-08-15 DIAGNOSIS — O09293 Supervision of pregnancy with other poor reproductive or obstetric history, third trimester: Secondary | ICD-10-CM

## 2016-08-15 DIAGNOSIS — O09299 Supervision of pregnancy with other poor reproductive or obstetric history, unspecified trimester: Secondary | ICD-10-CM

## 2016-08-15 NOTE — Progress Notes (Signed)
US for growth scheduled 11/2. 

## 2016-08-16 NOTE — Progress Notes (Signed)
NST performed today was reviewed and was found to be reactive.  Continue recommended antenatal testing and prenatal care.  

## 2016-08-19 ENCOUNTER — Ambulatory Visit (INDEPENDENT_AMBULATORY_CARE_PROVIDER_SITE_OTHER): Payer: BLUE CROSS/BLUE SHIELD | Admitting: *Deleted

## 2016-08-19 ENCOUNTER — Telehealth (HOSPITAL_COMMUNITY): Payer: Self-pay | Admitting: *Deleted

## 2016-08-19 VITALS — BP 103/62 | HR 69

## 2016-08-19 DIAGNOSIS — O09293 Supervision of pregnancy with other poor reproductive or obstetric history, third trimester: Secondary | ICD-10-CM | POA: Diagnosis not present

## 2016-08-19 DIAGNOSIS — O09299 Supervision of pregnancy with other poor reproductive or obstetric history, unspecified trimester: Secondary | ICD-10-CM

## 2016-08-19 NOTE — Progress Notes (Signed)
NST reviewed - reactive 

## 2016-08-19 NOTE — Telephone Encounter (Signed)
Preadmission screen  

## 2016-08-19 NOTE — Progress Notes (Signed)
US for growth scheduled 11/2.

## 2016-08-20 ENCOUNTER — Encounter (HOSPITAL_COMMUNITY): Payer: Self-pay

## 2016-08-20 NOTE — Pre-Procedure Instructions (Signed)
Interpreter number [id number] 

## 2016-08-22 ENCOUNTER — Encounter: Payer: Self-pay | Admitting: Obstetrics & Gynecology

## 2016-08-22 ENCOUNTER — Ambulatory Visit (INDEPENDENT_AMBULATORY_CARE_PROVIDER_SITE_OTHER): Payer: BLUE CROSS/BLUE SHIELD | Admitting: Obstetrics & Gynecology

## 2016-08-22 ENCOUNTER — Encounter (HOSPITAL_COMMUNITY): Payer: Self-pay

## 2016-08-22 ENCOUNTER — Ambulatory Visit (HOSPITAL_COMMUNITY)
Admission: RE | Admit: 2016-08-22 | Discharge: 2016-08-22 | Disposition: A | Discharge status: Home / Self Care | Payer: BLUE CROSS/BLUE SHIELD | Source: Ambulatory Visit | Attending: Obstetrics and Gynecology | Admitting: Obstetrics and Gynecology

## 2016-08-22 ENCOUNTER — Other Ambulatory Visit: Payer: Self-pay | Admitting: Obstetrics & Gynecology

## 2016-08-22 ENCOUNTER — Other Ambulatory Visit (HOSPITAL_COMMUNITY): Payer: Self-pay | Admitting: Obstetrics and Gynecology

## 2016-08-22 DIAGNOSIS — J45909 Unspecified asthma, uncomplicated: Secondary | ICD-10-CM | POA: Insufficient documentation

## 2016-08-22 DIAGNOSIS — O09293 Supervision of pregnancy with other poor reproductive or obstetric history, third trimester: Secondary | ICD-10-CM

## 2016-08-22 DIAGNOSIS — O0990 Supervision of high risk pregnancy, unspecified, unspecified trimester: Secondary | ICD-10-CM

## 2016-08-22 DIAGNOSIS — O99513 Diseases of the respiratory system complicating pregnancy, third trimester: Secondary | ICD-10-CM | POA: Diagnosis not present

## 2016-08-22 DIAGNOSIS — Z3483 Encounter for supervision of other normal pregnancy, third trimester: Secondary | ICD-10-CM | POA: Diagnosis not present

## 2016-08-22 DIAGNOSIS — Z3A38 38 weeks gestation of pregnancy: Secondary | ICD-10-CM | POA: Diagnosis not present

## 2016-08-22 DIAGNOSIS — Z3009 Encounter for other general counseling and advice on contraception: Secondary | ICD-10-CM

## 2016-08-22 DIAGNOSIS — Z8759 Personal history of other complications of pregnancy, childbirth and the puerperium: Secondary | ICD-10-CM

## 2016-08-22 DIAGNOSIS — O99519 Diseases of the respiratory system complicating pregnancy, unspecified trimester: Secondary | ICD-10-CM

## 2016-08-22 DIAGNOSIS — O34219 Maternal care for unspecified type scar from previous cesarean delivery: Secondary | ICD-10-CM

## 2016-08-22 DIAGNOSIS — O09299 Supervision of pregnancy with other poor reproductive or obstetric history, unspecified trimester: Secondary | ICD-10-CM

## 2016-08-22 DIAGNOSIS — O09213 Supervision of pregnancy with history of pre-term labor, third trimester: Secondary | ICD-10-CM | POA: Insufficient documentation

## 2016-08-22 NOTE — Progress Notes (Signed)
Video Interpreter # [phone]

## 2016-08-22 NOTE — Progress Notes (Signed)
   PRENATAL VISIT NOTE  Subjective:  Valerie [name] is a 30 y.o. [phone] at 4914w0d being seen today for ongoing prenatal care.  She is currently monitored for the following issues for this high-risk pregnancy and has Asthma affecting pregnancy, antepartum; Supervision of high risk pregnancy, antepartum; Group B Streptococcus carrier, +RV culture, currently pregnant; UTI in pregnancy; History of 2 cesarean sections; Sterilization consult; and History of unexplained stillbirth on her problem list.  Patient reports no complaints.  Contractions: Not present. Vag. Bleeding: None.  Movement: Present. Denies leaking of fluid.   The following portions of the patient's history were reviewed and updated as appropriate: allergies, current medications, past family history, past medical history, past social history, past surgical history and problem list. Problem list updated.  Objective:   Vitals:   08/22/16 1337  BP: 99/60  Pulse: (!) 117  Weight: 215 lb 9.6 oz (97.8 kg)    Fetal Status: Fetal Heart Rate (bpm): NST   Movement: Present     General:  Alert, oriented and cooperative. Patient is in no acute distress.  Skin: Skin is warm and dry. No rash noted.   Cardiovascular: Normal heart rate noted  Respiratory: Normal respiratory effort, no problems with respiration noted  Abdomen: Soft, gravid, appropriate for gestational age. Pain/Pressure: Present     Pelvic:  Cervical exam deferred        Extremities: Normal range of motion.  Edema: None  Mental Status: Normal mood and affect. Normal behavior. Normal judgment and thought content.   Assessment and Plan:  Pregnancy: [id number] at 4314w0d  1. History of unexplained stillbirth NST today is reactive, [place] is scheduled today  Term labor symptoms and general obstetric precautions including but not limited to vaginal bleeding, contractions, leaking of fluid and fetal movement were reviewed in detail with the patient. Please refer to After Visit  Summary for other counseling recommendations.  Return in about 6 weeks (around 10/03/2016) for postpartum. 39 week CS is scheduled 1 week Adam PhenixJames G Leshaun Biebel, MD

## 2016-08-23 ENCOUNTER — Ambulatory Visit (HOSPITAL_COMMUNITY): Payer: BLUE CROSS/BLUE SHIELD

## 2016-08-28 ENCOUNTER — Encounter (HOSPITAL_COMMUNITY)
Admission: RE | Admit: 2016-08-28 | Discharge: 2016-08-28 | Disposition: A | Discharge status: Home / Self Care | Payer: BLUE CROSS/BLUE SHIELD | Source: Ambulatory Visit | Attending: Obstetrics & Gynecology | Admitting: Obstetrics & Gynecology

## 2016-08-28 LAB — CBC
HEMATOCRIT: 34.4 % — AB (ref 36.0–46.0)
HEMOGLOBIN: 11.2 g/dL — AB (ref 12.0–15.0)
MCH: 25.6 pg — ABNORMAL LOW (ref 26.0–34.0)
MCHC: 32.6 g/dL (ref 30.0–36.0)
MCV: 78.5 fL (ref 78.0–100.0)
Platelets: 246 10*3/uL (ref 150–400)
RBC: 4.38 MIL/uL (ref 3.87–5.11)
RDW: 15.7 % — ABNORMAL HIGH (ref 11.5–15.5)
WBC: 7.6 10*3/uL (ref 4.0–10.5)

## 2016-08-28 LAB — TYPE AND SCREEN
ABO/RH(D): O POS
ANTIBODY SCREEN: NEGATIVE

## 2016-08-28 LAB — ABO/RH: ABO/RH(D): O POS

## 2016-08-28 NOTE — Patient Instructions (Signed)
Instrucciones Para Antes de la Ciruga   Su ciruga est programada para 08/29/2016  (your procedure is scheduled on) Entre por la entrada principal del [facility]  a las [address] de la [place] -(enter through the main entrance at [facility] at  [facility]    Levante el telfono, [place]arque el [phone] para informarnos de su llegada. (pick up phone, dial [phone] on arrival)     Por favor llame al [phone] si tiene algn problema en la maana de la ciruga (please call  if you have any problems the morning of surgery.)                  Recuerde: (Remember)  No coma alimentos. No come despues al media (Do not eat food )    No tome lquidos claros. No bebe despues al media. (Do not drink clear liquids)    No use joyas, maquillaje de ojos, lpiz labial, crema para el cuerpo o esmalte de uas oscuro. (Do not wear jewelry, eye makeup, lipstick, body lotion, or dark fingernail polish). Puede usar desodorante (you may wear deodorant)    No se afeite 48 horas de su ciruga. (Do not shave 48 hours before your surgery)    No traiga objetos de valor al hospital.  [place] no se hace responsable de ninguna pertenencia, ni objetos de valor que haya trado al hospital. (Do not bring valuable to the hospital.   is not responsible for any belongings or valuables brought to the hospital)   [facility] estas medicinas en la maana de la ciruga con un SORBITO de agua nada (take these meds the morning of surgery with a SIP of water)     Durante la ciruga no se pueden usar lentes de contacto, dentaduras o puentes. (Contacts, dentures or bridgework cannot be worn in surgery).   Si va a ser ingresado despus de la ciruga, deje la AMR Corporationmaleta en el carro hasta que se le haya asignado una habitacin. (If you are to be admitted after surgery, leave suitcase in car until your room has been assigned.)   A los pacientes que se les d de alta el mismo da no se les permitir  manejar a casa.  (Patients discharged on the day of surgery will not be allowed to drive home)    [place] Gu[place]ombre y nmero de telfono del Engineer, productionconductor nada. (Name and telephone number of your driver)   Instrucciones especiales N/A (Special Instructions)   Por favor, lea las hojas informativas que le entregaron. (Please read over the following fact sheets that you were given) Surgical Site Infection Prevention

## 2016-08-29 ENCOUNTER — Inpatient Hospital Stay (HOSPITAL_COMMUNITY): Payer: BLUE CROSS/BLUE SHIELD | Admitting: Anesthesiology

## 2016-08-29 ENCOUNTER — Inpatient Hospital Stay (HOSPITAL_COMMUNITY)
Admission: RE | Admit: 2016-08-29 | Discharge: 2016-08-31 | DRG: 766 | Disposition: A | Discharge status: Home / Self Care | Payer: BLUE CROSS/BLUE SHIELD | Source: Ambulatory Visit | Attending: Obstetrics & Gynecology | Admitting: Obstetrics & Gynecology

## 2016-08-29 ENCOUNTER — Encounter (HOSPITAL_COMMUNITY): Payer: Self-pay

## 2016-08-29 ENCOUNTER — Encounter (HOSPITAL_COMMUNITY)
Admission: RE | Disposition: A | Discharge status: Home / Self Care | Payer: Self-pay | Source: Ambulatory Visit | Attending: Obstetrics & Gynecology

## 2016-08-29 DIAGNOSIS — Z302 Encounter for sterilization: Secondary | ICD-10-CM

## 2016-08-29 DIAGNOSIS — O34211 Maternal care for low transverse scar from previous cesarean delivery: Secondary | ICD-10-CM | POA: Diagnosis present

## 2016-08-29 DIAGNOSIS — Z3A39 39 weeks gestation of pregnancy: Secondary | ICD-10-CM | POA: Diagnosis not present

## 2016-08-29 DIAGNOSIS — O34219 Maternal care for unspecified type scar from previous cesarean delivery: Secondary | ICD-10-CM | POA: Diagnosis not present

## 2016-08-29 DIAGNOSIS — Z98891 History of uterine scar from previous surgery: Secondary | ICD-10-CM

## 2016-08-29 LAB — RPR: RPR Ser Ql: NONREACTIVE

## 2016-08-29 SURGERY — Surgical Case
Anesthesia: Spinal | Site: Abdomen | Laterality: Bilateral | Wound class: Clean Contaminated

## 2016-08-29 MED ORDER — OXYTOCIN 40 UNITS IN LACTATED RINGERS INFUSION - SIMPLE MED: 2.5000 [IU]/h | INTRAVENOUS | Status: AC

## 2016-08-29 MED ORDER — OXYTOCIN 10 UNIT/ML IJ SOLN
INTRAMUSCULAR | Status: AC
  Filled 2016-08-29: qty 4

## 2016-08-29 MED ORDER — LACTATED RINGERS IV SOLN
Freq: Once | INTRAVENOUS | Status: AC
  Administered 2016-08-29: 1000 mL/h via INTRAVENOUS

## 2016-08-29 MED ORDER — ACETAMINOPHEN 500 MG PO TABS
1000.0000 mg | ORAL_TABLET | Freq: Four times a day (QID) | ORAL | Status: AC
  Administered 2016-08-29 – 2016-08-30 (×3): 1000 mg via ORAL
  Filled 2016-08-29 (×3): qty 2

## 2016-08-29 MED ORDER — NALBUPHINE HCL 10 MG/ML IJ SOLN
5.0000 mg | INTRAMUSCULAR | Status: DC | PRN
  Administered 2016-08-29: 5 mg via SUBCUTANEOUS
  Filled 2016-08-29: qty 1

## 2016-08-29 MED ORDER — MORPHINE SULFATE-NACL 0.5-0.9 MG/ML-% IV SOSY
PREFILLED_SYRINGE | INTRAVENOUS | Status: AC
  Filled 2016-08-29: qty 1

## 2016-08-29 MED ORDER — DIPHENHYDRAMINE HCL 25 MG PO CAPS
25.0000 mg | ORAL_CAPSULE | ORAL | Status: DC | PRN
  Administered 2016-08-30: 25 mg via ORAL
  Filled 2016-08-29 (×2): qty 1

## 2016-08-29 MED ORDER — BUPIVACAINE HCL (PF) 0.5 % IJ SOLN
INTRAMUSCULAR | Status: DC | PRN
  Administered 2016-08-29: 30 mL

## 2016-08-29 MED ORDER — WITCH HAZEL-GLYCERIN EX PADS: 1.0000 "application " | MEDICATED_PAD | CUTANEOUS | Status: DC | PRN

## 2016-08-29 MED ORDER — DEXTROSE 5 % IV SOLN
1.0000 ug/kg/h | INTRAVENOUS | Status: DC | PRN
  Filled 2016-08-29: qty 2

## 2016-08-29 MED ORDER — SIMETHICONE 80 MG PO CHEW
80.0000 mg | CHEWABLE_TABLET | ORAL | Status: DC
  Administered 2016-08-30 (×2): 80 mg via ORAL
  Filled 2016-08-29 (×2): qty 1

## 2016-08-29 MED ORDER — LACTATED RINGERS IV SOLN
INTRAVENOUS | Status: DC
  Administered 2016-08-29 (×2): via INTRAVENOUS

## 2016-08-29 MED ORDER — ONDANSETRON HCL 4 MG/2ML IJ SOLN
INTRAMUSCULAR | Status: AC
  Filled 2016-08-29: qty 2

## 2016-08-29 MED ORDER — SCOPOLAMINE 1 MG/3DAYS TD PT72
1.0000 | MEDICATED_PATCH | Freq: Once | TRANSDERMAL | Status: DC
  Administered 2016-08-29: 1.5 mg via TRANSDERMAL

## 2016-08-29 MED ORDER — COCONUT OIL OIL
1.0000 "application " | TOPICAL_OIL | Status: DC | PRN
  Administered 2016-08-30: 1 via TOPICAL
  Filled 2016-08-29: qty 120

## 2016-08-29 MED ORDER — LACTATED RINGERS IV SOLN: INTRAVENOUS | Status: DC | PRN

## 2016-08-29 MED ORDER — PHENYLEPHRINE 8 MG IN D5W 100 ML (0.08MG/ML) PREMIX OPTIME
INJECTION | INTRAVENOUS | Status: DC | PRN
  Administered 2016-08-29: 30 ug/min via INTRAVENOUS

## 2016-08-29 MED ORDER — HYDROMORPHONE HCL 1 MG/ML IJ SOLN
0.2500 mg | INTRAMUSCULAR | Status: DC | PRN
  Administered 2016-08-29: 0.25 mg via INTRAVENOUS

## 2016-08-29 MED ORDER — SIMETHICONE 80 MG PO CHEW
80.0000 mg | CHEWABLE_TABLET | Freq: Three times a day (TID) | ORAL | Status: DC
  Administered 2016-08-29 – 2016-08-31 (×5): 80 mg via ORAL
  Filled 2016-08-29 (×4): qty 1

## 2016-08-29 MED ORDER — LACTATED RINGERS IV SOLN
INTRAVENOUS | Status: DC
  Administered 2016-08-29 (×4): via INTRAVENOUS

## 2016-08-29 MED ORDER — TETANUS-DIPHTH-ACELL PERTUSSIS 5-2.5-18.5 LF-MCG/0.5 IM SUSP: 0.5000 mL | Freq: Once | INTRAMUSCULAR | Status: DC

## 2016-08-29 MED ORDER — MEPERIDINE HCL 25 MG/ML IJ SOLN: 6.2500 mg | INTRAMUSCULAR | Status: DC | PRN

## 2016-08-29 MED ORDER — SIMETHICONE 80 MG PO CHEW: 80.0000 mg | CHEWABLE_TABLET | ORAL | Status: DC | PRN

## 2016-08-29 MED ORDER — LACTATED RINGERS IV SOLN
INTRAVENOUS | Status: DC
  Administered 2016-08-29: 10:00:00 via INTRAVENOUS

## 2016-08-29 MED ORDER — SCOPOLAMINE 1 MG/3DAYS TD PT72
MEDICATED_PATCH | TRANSDERMAL | Status: DC | PRN
  Administered 2016-08-29: 1 via TRANSDERMAL

## 2016-08-29 MED ORDER — MORPHINE SULFATE-NACL 0.5-0.9 MG/ML-% IV SOSY
PREFILLED_SYRINGE | INTRAVENOUS | Status: DC | PRN
  Administered 2016-08-29: .1 mg via INTRATHECAL

## 2016-08-29 MED ORDER — FENTANYL CITRATE (PF) 100 MCG/2ML IJ SOLN
INTRAMUSCULAR | Status: AC
  Filled 2016-08-29: qty 2

## 2016-08-29 MED ORDER — ACETAMINOPHEN 325 MG PO TABS: 650.0000 mg | ORAL_TABLET | ORAL | Status: DC | PRN

## 2016-08-29 MED ORDER — MENTHOL 3 MG MT LOZG: 1.0000 | LOZENGE | OROMUCOSAL | Status: DC | PRN

## 2016-08-29 MED ORDER — SCOPOLAMINE 1 MG/3DAYS TD PT72: 1.0000 | MEDICATED_PATCH | Freq: Once | TRANSDERMAL | Status: DC

## 2016-08-29 MED ORDER — SCOPOLAMINE 1 MG/3DAYS TD PT72
MEDICATED_PATCH | TRANSDERMAL | Status: AC
  Administered 2016-08-29: 1.5 mg via TRANSDERMAL
  Filled 2016-08-29: qty 1

## 2016-08-29 MED ORDER — SENNOSIDES-DOCUSATE SODIUM 8.6-50 MG PO TABS
2.0000 | ORAL_TABLET | ORAL | Status: DC
  Administered 2016-08-30 (×2): 2 via ORAL
  Filled 2016-08-29 (×2): qty 2

## 2016-08-29 MED ORDER — NALBUPHINE HCL 10 MG/ML IJ SOLN: 5.0000 mg | Freq: Once | INTRAMUSCULAR | Status: DC | PRN

## 2016-08-29 MED ORDER — NALOXONE HCL 0.4 MG/ML IJ SOLN: 0.4000 mg | INTRAMUSCULAR | Status: DC | PRN

## 2016-08-29 MED ORDER — BUPIVACAINE HCL (PF) 0.5 % IJ SOLN
INTRAMUSCULAR | Status: AC
  Filled 2016-08-29: qty 30

## 2016-08-29 MED ORDER — SODIUM CHLORIDE 0.9% FLUSH: 3.0000 mL | INTRAVENOUS | Status: DC | PRN

## 2016-08-29 MED ORDER — PHENYLEPHRINE 40 MCG/ML (10ML) SYRINGE FOR IV PUSH (FOR BLOOD PRESSURE SUPPORT)
PREFILLED_SYRINGE | INTRAVENOUS | Status: AC
  Filled 2016-08-29: qty 20

## 2016-08-29 MED ORDER — DIBUCAINE 1 % RE OINT: 1.0000 "application " | TOPICAL_OINTMENT | RECTAL | Status: DC | PRN

## 2016-08-29 MED ORDER — NALBUPHINE HCL 10 MG/ML IJ SOLN
5.0000 mg | INTRAMUSCULAR | Status: DC | PRN
  Administered 2016-08-29: 19:00:00 via INTRAVENOUS
  Filled 2016-08-29: qty 1

## 2016-08-29 MED ORDER — ZOLPIDEM TARTRATE 5 MG PO TABS: 5.0000 mg | ORAL_TABLET | Freq: Every evening | ORAL | Status: DC | PRN

## 2016-08-29 MED ORDER — PRENATAL MULTIVITAMIN CH
1.0000 | ORAL_TABLET | Freq: Every day | ORAL | Status: DC
  Administered 2016-08-30 – 2016-08-31 (×2): 1 via ORAL
  Filled 2016-08-29 (×2): qty 1

## 2016-08-29 MED ORDER — ONDANSETRON HCL 4 MG/2ML IJ SOLN: 4.0000 mg | Freq: Three times a day (TID) | INTRAMUSCULAR | Status: DC | PRN

## 2016-08-29 MED ORDER — OXYTOCIN 40 UNITS IN LACTATED RINGERS INFUSION - SIMPLE MED
INTRAVENOUS | Status: DC | PRN
  Administered 2016-08-29: 40 [IU] via INTRAVENOUS

## 2016-08-29 MED ORDER — HYDROMORPHONE HCL 1 MG/ML IJ SOLN
INTRAMUSCULAR | Status: AC
  Filled 2016-08-29: qty 1

## 2016-08-29 MED ORDER — ONDANSETRON HCL 4 MG/2ML IJ SOLN
INTRAMUSCULAR | Status: DC | PRN
  Administered 2016-08-29: 4 mg via INTRAVENOUS

## 2016-08-29 MED ORDER — FENTANYL CITRATE (PF) 100 MCG/2ML IJ SOLN
INTRAMUSCULAR | Status: DC | PRN
  Administered 2016-08-29: 25 ug via INTRATHECAL

## 2016-08-29 MED ORDER — CEFAZOLIN SODIUM-DEXTROSE 2-4 GM/100ML-% IV SOLN
2.0000 g | INTRAVENOUS | Status: AC
  Administered 2016-08-29: 2 g via INTRAVENOUS

## 2016-08-29 MED ORDER — IBUPROFEN 600 MG PO TABS
600.0000 mg | ORAL_TABLET | Freq: Four times a day (QID) | ORAL | Status: DC
  Administered 2016-08-29 – 2016-08-31 (×8): 600 mg via ORAL
  Filled 2016-08-29 (×8): qty 1

## 2016-08-29 MED ORDER — SODIUM CHLORIDE 0.9 % IR SOLN
Status: DC | PRN
  Administered 2016-08-29: 1000 mL

## 2016-08-29 MED ORDER — DIPHENHYDRAMINE HCL 50 MG/ML IJ SOLN: 12.5000 mg | INTRAMUSCULAR | Status: DC | PRN

## 2016-08-29 SURGICAL SUPPLY — 26 items
BARRIER ADHS 3X4 INTERCEED (GAUZE/BANDAGES/DRESSINGS) IMPLANT
CLAMP CORD UMBIL (MISCELLANEOUS) ×2 IMPLANT
CLIP FILSHIE TUBAL LIGA STRL (Clip) ×2 IMPLANT
CLOTH BEACON ORANGE TIMEOUT ST (SAFETY) ×2 IMPLANT
DRSG OPSITE POSTOP 4X10 (GAUZE/BANDAGES/DRESSINGS) ×2 IMPLANT
DURAPREP 26ML APPLICATOR (WOUND CARE) ×2 IMPLANT
ELECT REM PT RETURN 9FT ADLT (ELECTROSURGICAL) ×2
ELECTRODE REM PT RTRN 9FT ADLT (ELECTROSURGICAL) ×1 IMPLANT
GLOVE BIO SURGEON STRL SZ 6.5 (GLOVE) ×2 IMPLANT
GLOVE BIOGEL PI IND STRL 7.0 (GLOVE) ×2 IMPLANT
GLOVE BIOGEL PI INDICATOR 7.0 (GLOVE) ×2
GOWN STRL REUS W/TWL LRG LVL3 (GOWN DISPOSABLE) ×4 IMPLANT
NS IRRIG 1000ML POUR BTL (IV SOLUTION) ×2 IMPLANT
PACK C SECTION WH (CUSTOM PROCEDURE TRAY) ×2 IMPLANT
PAD OB MATERNITY 4.3X12.25 (PERSONAL CARE ITEMS) ×2 IMPLANT
PENCIL SMOKE EVAC W/HOLSTER (ELECTROSURGICAL) ×2 IMPLANT
RTRCTR C-SECT PINK 25CM LRG (MISCELLANEOUS) ×2 IMPLANT
SUT PLAIN 2 0 (SUTURE) ×1
SUT PLAIN ABS 2-0 CT1 27XMFL (SUTURE) ×1 IMPLANT
SUT VIC AB 0 CT1 36 (SUTURE) ×12 IMPLANT
SUT VIC AB 2-0 CT1 27 (SUTURE) ×1
SUT VIC AB 2-0 CT1 TAPERPNT 27 (SUTURE) ×1 IMPLANT
SUT VIC AB 4-0 PS2 27 (SUTURE) ×2 IMPLANT
SYR CONTROL 10ML LL (SYRINGE) ×2 IMPLANT
TOWEL OR 17X24 6PK STRL BLUE (TOWEL DISPOSABLE) ×2 IMPLANT
TRAY FOLEY CATH SILVER 14FR (SET/KITS/TRAYS/PACK) ×2 IMPLANT

## 2016-08-29 NOTE — Anesthesia Preprocedure Evaluation (Signed)
Anesthesia Evaluation  Patient identified by MRN, date of birth, ID band Patient awake    Reviewed: Allergy & Precautions, H&P , Patient's Chart, lab work & pertinent test results  Airway Mallampati: II  TM Distance: >3 FB Neck ROM: full    Dental no notable dental hx.    Pulmonary asthma ,    Pulmonary exam normal breath sounds clear to auscultation       Cardiovascular Exercise Tolerance: Good  Rhythm:regular Rate:Normal     Neuro/Psych    GI/Hepatic   Endo/Other    Renal/GU      Musculoskeletal   Abdominal   Peds  Hematology   Anesthesia Other Findings   Reproductive/Obstetrics                             Anesthesia Physical Anesthesia Plan  ASA: II  Anesthesia Plan: Spinal   Post-op Pain Management:    Induction:   Airway Management Planned:   Additional Equipment:   Intra-op Plan:   Post-operative Plan:   Informed Consent: I have reviewed the patients History and Physical, chart, labs and discussed the procedure including the risks, benefits and alternatives for the proposed anesthesia with the patient or authorized representative who has indicated his/her understanding and acceptance.   Dental Advisory Given  Plan Discussed with: CRNA  Anesthesia Plan Comments: (Lab work confirmed with CRNA in room. Platelets okay. Discussed spinal anesthetic, and patient consents to the procedure:  included risk of possible headache,backache, failed block, allergic reaction, and nerve injury. This patient was asked if she had any questions or concerns before the procedure started. )        Anesthesia Quick Evaluation

## 2016-08-29 NOTE — Anesthesia Postprocedure Evaluation (Signed)
Anesthesia Post Note  Patient: Valerie [name]  Procedure(s) Performed: Procedure(s) (LRB): CESAREAN SECTION WITH BILATERAL TUBAL LIGATION (Bilateral)  Patient location during evaluation: Mother Baby Anesthesia Type: Spinal Level of consciousness: awake Pain management: satisfactory to patient Vital Signs Assessment: post-procedure vital signs reviewed and stable Respiratory status: spontaneous breathing Cardiovascular status: stable Anesthetic complications: no     Last Vitals:  Vitals:   08/29/16 1830 08/29/16 1835  BP: (!) 100/48 (!) 100/45  Pulse: (!) 56 63  Resp: 20 20  Temp: 37 C     Last Pain:  Vitals:   08/29/16 1830  TempSrc: Oral  PainSc:    Pain Goal: Patients Stated Pain Goal: 4 (08/29/16 0818)               Cephus ShellingBURGER,Merlon Alcorta

## 2016-08-29 NOTE — Lactation Note (Addendum)
This note was copied from a baby's chart. Lactation Consultation Note  Patient Name: Valerie [name] ZOXWR'UToday's Date: 08/29/2016 Reason for consult: Initial assessment   Initial consult with mom in PACU. Spoke with mother with the assistance of Valerie [name], Hospital Interpreter. This is mom's 3rd infant. She did not BF her older children due to difficult latch and "I didn't have any milk". Mom plans to breast and formula feed this infant. Per RN, infant attempted to BF on right breast in OR and was unable to latch . He was STS with mom and displaying feeding cues.   Mom with large compressible breasts and areola. Left nipple is everted, right nipple is flat and inverts with compression. Infant latched easily in the laid back position to left breast with flanged lips, rhythmic suckles and intermittent swallows. After 20 minutes he released. We then latched him to the right breast. He was able to latch but mom experienced pain and pinching, infant did pull nipple out a very small amount. We tried a few times and then removed him due to the pain/pinching. He was then returned to the left breast and latched and was actively feeding when Ieft the room.  Enc mom to feed infant at breast 8-12 x in 24 hours at first feeding cues. Enc her to BF first to stimulate production before giving formula. Mom voiced understanding. Discussed supply and demand, colostrum and milk coming to volume. Showed mom how to hand express and colostrum noted to right breast. Feeding log given with instructions for use.   Discussed with mom that we can try a manual pump prior to latch on the right breast and that a NS may also help the right breast. Will need to follow up with mom later today to assess for need of NS.   LC brochure and BF Resources Handout given, mom informed of IP/OP Services, BF support Groups and LC phone #. Mom does not have a pump at home. Enc mom to call out to desk for feeding assistance as needed. Report to  Valerie ChestnutMelinda Kozak, RN.         Maternal Data Formula Feeding for Exclusion: Yes Reason for exclusion: Mother's choice to formula and breast feed on admission Has patient been taught Hand Expression?: Yes Does the patient have breastfeeding experience prior to this delivery?: No (did not BF her older children due to difficult latch)  Feeding Feeding Type: Breast Fed Length of feed: 20 min (still feeding when I left the room)  LATCH Score/Interventions Latch: Grasps breast easily, tongue down, lips flanged, rhythmical sucking.  Audible Swallowing: Spontaneous and intermittent  Type of Nipple: Everted at rest and after stimulation (left nipple everted, right nipple flat and inverts with stimulation)  Comfort (Breast/Nipple): Soft / non-tender     Hold (Positioning): Assistance needed to correctly position infant at breast and maintain latch. Intervention(s): Breastfeeding basics reviewed;Support Pillows;Skin to skin  LATCH Score: 9  Lactation Tools Discussed/Used WIC Program: No   Consult Status Consult Status: Follow-up Date: 08/29/16 Follow-up type: In-patient    Valerie [name] 08/29/2016, 11:36 AM

## 2016-08-29 NOTE — Anesthesia Procedure Notes (Signed)
Spinal  Patient location during procedure: OR Preanesthetic Checklist Completed: patient identified, site marked, surgical consent, pre-op evaluation, timeout performed, IV checked, risks and benefits discussed and monitors and equipment checked Spinal Block Patient position: sitting Prep: DuraPrep Patient monitoring: cardiac monitor, continuous pulse ox, blood pressure and heart rate Approach: midline Location: L3-4 Injection technique: catheter Needle Needle type: Tuohy and Sprotte  Needle gauge: 24 G Needle length: 12.7 cm Needle insertion depth: 7 cm Catheter type: closed end flexible Catheter size: 19 g Catheter at skin depth: 14 cm Additional Notes Spinal Dosage in OR  Bupivicaine ml       1.6 PFMS04   mcg        100 Fentanyl mcg            25

## 2016-08-29 NOTE — Transfer of Care (Signed)
Immediate Anesthesia Transfer of Care Note  Patient: [name]  Procedure(s) Performed: Procedure(s): CESAREAN SECTION WITH BILATERAL TUBAL LIGATION (Bilateral)  Patient Location: PACU  Anesthesia Type:Spinal  Level of Consciousness: awake, alert  and oriented  Airway & Oxygen Therapy: Patient Spontanous Breathing  Post-op Assessment: Report given to RN and Post -op Vital signs reviewed and stable  Post vital signs: Reviewed and stable  Last Vitals:  Vitals:   08/29/16 0807 08/29/16 1100  BP: (!) 105/50 (!) 105/57  Pulse: (!) 54 (!) 52  Resp: 16 10  Temp: 36.7 C     Last Pain:  Vitals:   08/29/16 0807  TempSrc: Oral      Patients Stated Pain Goal: 4 (08/29/16 0818)  Complications: No apparent anesthesia complications

## 2016-08-29 NOTE — H&P (Signed)
Valerie [name] is a 30 y.o. female presenting for repeat cesarean section and BTL. 892w0d [id number]     OB History    Gravida Para Term Preterm AB Living   5 3 2 1 1 2    SAB TAB Ectopic Multiple Live Births   1 0 0 0 2      Past Medical History:  Diagnosis Date  . Asthma   . PONV (postoperative nausea and vomiting)    Past Surgical History:  Procedure Laterality Date  . CESAREAN SECTION    . OVARIAN CYST REMOVAL     Family History: family history is not on file. Social History:  reports that she has never smoked. She has never used smokeless tobacco. She reports that she does not drink alcohol or use drugs.     Maternal Diabetes: No Genetic Screening: Normal Maternal Ultrasounds/Referrals: Normal Fetal Ultrasounds or other Referrals:  None Maternal Substance Abuse:  No Significant Maternal Medications:  None Significant Maternal Lab Results:  None Other Comments:  None  Review of Systems  Constitutional: Negative.   Respiratory: Negative.   Cardiovascular: Negative.   Genitourinary: Negative.    Maternal Medical History:  Fetal activity: Perceived fetal activity is normal.   Last perceived fetal movement was within the past hour.    Prenatal complications: no prenatal complications Prenatal Complications - Diabetes: none.      Blood pressure (!) 105/50, pulse (!) 54, temperature 98.1 F (36.7 C), temperature source Oral, resp. rate 16, last menstrual period 11/30/2015, SpO2 99 %. Maternal Exam:  Abdomen: Patient reports no abdominal tenderness. Surgical scars: low transverse.   Introitus: not evaluated.   Cervix: not evaluated.   Physical Exam  Constitutional: She is oriented to person, place, and time. She appears well-developed. No distress.  Cardiovascular: Normal rate.   Respiratory: Effort normal. No respiratory distress.  GI: There is no tenderness.  Gravid c/w dates  Musculoskeletal: Normal range of motion.  Neurological: She is alert and  oriented to person, place, and time.  Skin: Skin is warm and dry.  Psychiatric: She has a normal mood and affect. Her behavior is normal.    Prenatal labs: ABO, Rh: --/--/O POS (11/08 1100) Antibody: NEG (11/08 1053) Rubella: 4.06 (07/10 1119) RPR: Non Reactive (11/08 1054)  HBsAg: NEGATIVE (07/10 1119)  HIV: NONREACTIVE (09/18 1546)  GBS:   positive  Assessment/Plan: The risks of cesarean section discussed with the patient included but were not limited to: bleeding which may require transfusion or reoperation; infection which may require antibiotics; injury to bowel, bladder, ureters or other surrounding organs; injury to the fetus; need for additional procedures including hysterectomy in the event of a life-threatening hemorrhage; placental abnormalities wth subsequent pregnancies, incisional problems, thromboembolic phenomenon and other postoperative/anesthesia complications. The patient concurred with the proposed plan, giving informed written consent for the procedure.   Patient has been NPO since 1730 11/8 she will remain NPO for procedure. Anesthesia and OR aware. Preoperative prophylactic antibiotics and SCDs ordered on call to the OR.  To OR when ready.     ARNOLD,JAMES 08/29/2016, 9:08 AM

## 2016-08-29 NOTE — Addendum Note (Signed)
Addendum  created 08/29/16 1931 by Algis GreenhouseLinda A Sadi Arave, CRNA   Sign clinical note

## 2016-08-29 NOTE — Anesthesia Postprocedure Evaluation (Signed)
Anesthesia Post Note  Patient: Valerie [name]  Procedure(s) Performed: Procedure(s) (LRB): CESAREAN SECTION WITH BILATERAL TUBAL LIGATION (Bilateral)  Patient location during evaluation: PACU Anesthesia Type: Spinal Level of consciousness: awake Pain management: satisfactory to patient Vital Signs Assessment: post-procedure vital signs reviewed and stable Respiratory status: spontaneous breathing Cardiovascular status: blood pressure returned to baseline Postop Assessment: no headache and spinal receding Anesthetic complications: no     Last Vitals:  Vitals:   08/29/16 1221 08/29/16 1330  BP: (!) 111/58 (!) 105/53  Pulse: (!) 49 (!) 54  Resp: 16 18  Temp: 36.7 C 36.8 C    Last Pain:  Vitals:   08/29/16 1230  TempSrc:   PainSc: 5    Pain Goal: Patients Stated Pain Goal: 4 (08/29/16 0818)               Cristela BlueJACKSON,Kele Withem EDWARD

## 2016-08-29 NOTE — Op Note (Signed)
Cesarean Section Procedure Note   Valerie [name]  08/29/2016  Indications: Scheduled Proceedure/Maternal Request   Pre-operative Diagnosis: REPEAT c-section, desires sterilization.   Post-operative Diagnosis: Same   Surgeon: Surgeon(s) and Role:    * Adam PhenixJames G Arnold, MD - Primary   Assistants: none  Anesthesia: epidural, spinal   Procedure Details:  The patient was seen in the Holding Room. The risks, benefits, complications, treatment options, and expected outcomes were discussed with the patient. The patient concurred with the proposed plan, giving informed consent. identified as Valerie [name] and the procedure verified as C-Section Delivery. A Time Out was held and the above information confirmed.  After induction of anesthesia, the patient was draped and prepped in the usual sterile manner. A transverse was made and carried down through the subcutaneous tissue to the fascia. Fascial incision was made and extended transversely. The fascia was separated from the underlying rectus tissue superiorly and inferiorly. The peritoneum was identified and entered. Peritoneal incision was extended longitudinally. The utero-vesical peritoneal reflection was incised transversely and the bladder flap was bluntly freed from the lower uterine segment. A low transverse uterine incision was made. Delivered from cephalic presentation was a  Living newborn infant(s) or Female with Apgar scores of 8 at one minute and 10 at five minutes. Cord ph was not sent the umbilical cord was clamped and cut cord blood was obtained for evaluation and for donation. The placenta was removed Intact and appeared normal. The uterine outline, tubes and ovaries appeared normal}. The uterine incision was closed with running locked sutures of 0 Vicryl.   Hemostasis was observed after a second layer of closure. The fallopian tubes were ligated with Filshie clips with good placement seen.The peritoneum was closed with 2-0 vicryl.  The fascia was then reapproximated with running sutures of 0 Vicryl. The subcutianeous closure was performed using 2-0plain gut. The skin was closed with 4-0Vicryl.   Instrument, sponge, and needle counts were correct prior the abdominal closure and were correct at the conclusion of the case.    Findings:   Estimated Blood Loss: 800 ml  Total IV Fluids: 3600 ml   Urine Output: 125CC OF clear urine  Complications: no complications  Disposition: PACU - hemodynamically stable.   Maternal Condition: stable   Baby condition / location:  Couplet care / Skin to Skin  Attending Attestation: I was present and scrubbed for the entire procedure.   Signed: Surgeon(s): Adam PhenixJames G Arnold, MD

## 2016-08-30 ENCOUNTER — Encounter (HOSPITAL_COMMUNITY): Payer: Self-pay | Admitting: Obstetrics & Gynecology

## 2016-08-30 LAB — CBC
HEMATOCRIT: 30.7 % — AB (ref 36.0–46.0)
Hemoglobin: 10.1 g/dL — ABNORMAL LOW (ref 12.0–15.0)
MCH: 25.4 pg — AB (ref 26.0–34.0)
MCHC: 32.9 g/dL (ref 30.0–36.0)
MCV: 77.3 fL — AB (ref 78.0–100.0)
Platelets: 228 10*3/uL (ref 150–400)
RBC: 3.97 MIL/uL (ref 3.87–5.11)
RDW: 15.7 % — ABNORMAL HIGH (ref 11.5–15.5)
WBC: 9.4 10*3/uL (ref 4.0–10.5)

## 2016-08-30 LAB — CCBB MATERNAL DONOR DRAW

## 2016-08-30 NOTE — Lactation Note (Signed)
This note was copied from a baby's chart. Lactation Consultation Note  Patient Name: Valerie [name] Valerie [name]'s Date: 08/30/2016 Reason for consult: Follow-up assessment Baby at 35 hr of life. Mom has bilateral nipple soreness. She has a horizontal crack on the upper quadrants of the L nipple. There is no skin break down on the R nipple at this visit. Given comfort gels and reviewed nipple care. Mom sits in the chair and dangles nipple over baby's face and allows him to latch only to the tip of the nipple. Had mom lean back and showed her how to compress the breast and guide baby on. Baby was able to achieve a deep pain free latch. Baby has a noticeable lingual frenulum with a mid tongue insertion point. He can extend tongue over gum ridge and lift tongue to midline. This could be the cause of the nipple or it could be the shallow latch.  Maternal Data    Feeding Feeding Type: Breast Fed Length of feed: 20 min  LATCH Score/Interventions Latch: Grasps breast easily, tongue down, lips flanged, rhythmical sucking. Intervention(s): Adjust position;Breast compression  Audible Swallowing: Spontaneous and intermittent  Type of Nipple: Everted at rest and after stimulation  Comfort (Breast/Nipple): Filling, red/small blisters or bruises, mild/mod discomfort  Problem noted: Cracked, bleeding, blisters, bruises;Mild/Moderate discomfort Interventions  (Cracked/bleeding/bruising/blister): Expressed breast milk to nipple Interventions (Mild/moderate discomfort): Comfort gels (shells and coconut oil)  Hold (Positioning): Assistance needed to correctly position infant at breast and maintain latch. Intervention(s): Position options  LATCH Score: 8  Lactation Tools Discussed/Used     Consult Status Consult Status: Follow-up Date: 08/31/16 Follow-up type: In-patient    [name] 08/30/2016, 9:56 PM

## 2016-08-30 NOTE — Progress Notes (Signed)
Patient's husband wishes to order her meals. Eda H Royal Interpreter.

## 2016-08-30 NOTE — Progress Notes (Signed)
POSTPARTUM PROGRESS NOTE  Post Op Day # 1 from rLTCS w BTL Subjective:  Valerie [name] is a 30 y.o. [id number] 6363w0d s/p rLTCS with BTL.  No acute events overnight.  Pt denies problems with ambulating, voiding or po intake.  She denies nausea or vomiting.  Pain is moderately controlled.  She has had flatus. She has not had bowel movement.  Lochia Small.   Objective: Blood pressure (!) 91/46, pulse 62, temperature 98.1 F (36.7 C), temperature source Oral, resp. rate 18, last menstrual period 11/30/2015, SpO2 98 %, unknown if currently breastfeeding.  Physical Exam:  General: alert, cooperative and no distress Lochia:normal flow Heart: RRR with intact distal pulses Abdomen: +BS, soft, nontender,  Uterine Fundus: firm,  DVT Evaluation: No calf swelling or tenderness    Recent Labs  08/28/16 1054 08/30/16 0613  HGB 11.2* 10.1*  HCT 34.4* 30.7*    Assessment/Plan:  ASSESSMENT: Valerie [name] is a 30 y.o. [id number] 7763w0d s/p rLTCS with BTL  Breastfeeding, Lactation consult and Contraception BTL (Complete)   LOS: 1 day   Deniece ReeJosue D Santos, MD 08/30/2016, 8:48 AM   CNM attestation Post Partum Day #1 I have seen and examined this patient and agree with above documentation in the resident's note.   Valerie [name] is a 30 y.o. [id number] s/p rLTCS and BTL.  Pt denies problems with ambulating, voiding or po intake. Pain is well controlled.  Plan for birth control is bilateral tubal ligation.  Method of Feeding: breast  PE:  BP (!) 91/46 (BP Location: Right Arm)   Pulse 62   Temp 98.1 F (36.7 C) (Oral)   Resp 18   LMP 11/30/2015   SpO2 98%   Breastfeeding? Unknown  Fundus firm  Plan for discharge: 08/31/16  Cam HaiSHAW, Sennie Borden, CNM 9:15 AM  08/30/2016

## 2016-08-30 NOTE — Lactation Note (Signed)
This note was copied from a baby's chart. Lactation Consultation Note Mom having difficulty BF, mom has flat nipple that inverts to Rt. Nipple. Lt. Nipple short shaft that everts w/finger stimulation. #20 NS fitted. Application taught. FOB interpreter. Hands on assist as well teaching mom what LC says. Mom still does her way and pulling breast tissue backwards, pulling nipple out of mouth. Wouldn't support breast well, encouraged to use and demonstrated "C" hold. Wash cloth rolled and put under breast for support. Encouraged to pre-pump to evert nipple prior to latching. Hand expression 2 ml colostrum. Mom has shells. Encouraged to wear bra and shells in am.  Reported to RN.  Patient Name: Valerie [name]: 08/30/2016 Reason for consult: Initial assessment   Maternal Data    Feeding Feeding Type: Breast Fed Length of feed: 10 min  LATCH Score/Interventions Latch: Repeated attempts needed to sustain latch, nipple held in mouth throughout feeding, stimulation needed to elicit sucking reflex. Intervention(s): Adjust position;Assist with latch;Breast massage;Breast compression  Audible Swallowing: A few with stimulation Intervention(s): Skin to skin;Hand expression;Alternate breast massage  Type of Nipple: Everted at rest and after stimulation (Rt. nipple flat) Intervention(s): Shells;Hand pump  Comfort (Breast/Nipple): Soft / non-tender     Hold (Positioning): Full assist, staff holds infant at breast Intervention(s): Breastfeeding basics reviewed;Support Pillows;Position options;Skin to skin  LATCH Score: 6  Lactation Tools Discussed/Used Tools: Pump;Shells;Nipple Shields Nipple shield size: 20 Shell Type: Inverted Breast pump type: Manual Pump Review: Setup, frequency, and cleaning;Milk Storage Initiated by:: RN  [name] initiated:: 08/30/16   Consult Status Consult Status: Follow-up [name]: 08/30/16 Follow-up type: In-patient    [name], [name] 08/30/2016, 2:21 AM

## 2016-08-31 DIAGNOSIS — Z98891 History of uterine scar from previous surgery: Secondary | ICD-10-CM

## 2016-08-31 MED ORDER — IBUPROFEN 600 MG PO TABS: 600.0000 mg | ORAL_TABLET | Freq: Four times a day (QID) | ORAL | 0 refills | Status: DC

## 2016-08-31 MED ORDER — OXYCODONE-ACETAMINOPHEN 5-325 MG PO TABS: 1.0000 | ORAL_TABLET | ORAL | 0 refills | Status: DC | PRN

## 2016-08-31 MED ORDER — DOCUSATE SODIUM 100 MG PO CAPS: 100.0000 mg | ORAL_CAPSULE | Freq: Two times a day (BID) | ORAL | 0 refills | Status: DC

## 2016-08-31 NOTE — Discharge Instructions (Signed)
Parto por cesrea - Cuidados posteriores  (Cesarean Delivery, Care After) Siga estas instrucciones durante las prximas semanas. Estas indicaciones le proporcionan informacin general acerca de cmo deber cuidarse despus del procedimiento. El mdico tambin podr darle instrucciones ms especficas. El tratamiento se ha planificado de acuerdo a las prcticas mdicas actuales, pero a veces se producen problemas. Comunquese con el mdico si tiene algn problema o tiene dudas cuando vuelva a su casa.  INSTRUCCIONES PARA EL CUIDADO EN EL HOGAR  Tome slo medicamentos de venta libre o recetados, segn las indicaciones del mdico.  No beba alcohol, especialmente si est amamantando o toma analgsicos.  Nomastique tabaco ni fume.  Contine con un adecuado cuidado perineal. El buen cuidado perineal incluye:  Higienizarse de adelante hacia atrs.  Mantener la zona perineal limpia.  Controlar diariamente el corte (incisin) y observar si aumenta el enrojecimiento, si supura, se hincha o se separa la piel.  Limpie la incisin suavemente con jabn y agua todos los das, y luego squela dando golpecitos. Si el mdico la autoriza, deje la incisin al descubierto. Use un apsito (vendaje) si drena lquido o la incisin parece irritada. Si las pequeas tiras [facility] que cruzan la incisin no se caen dentro de los 7 das, retrelas suavemente.  Abrace una almohada al toser o estornudar hasta que la incisin se cure. Esto ayuda a Best boy.  No conduzca vehculos ni opere maquinarias hasta que el mdico la autorice.  Dchese, lvese el cabello y tome baos de inmersin segn las indicaciones de su mdico.  Utilice un sostn que le ajuste bien y que brinde buen soporte a sus Glass blower/designer.  Limite el uso de bombachas de sostn o medias panty.  Beba suficiente lquido para Consulting civil engineer orina clara o de color amarillo plido.  Consuma todos los das alimentos ricos en fibra como cereales y panes  [place], arroz, frijoles y frutas frescas y verduras. Estos alimentos pueden ayudarla a prevenir o Cytogeneticist.  Reanude las actividades como subir escaleras, conducir automviles, levantar objetos pesados, hacer ejercicios o viajar cuando le indique su mdico.  Hable con su mdico acerca de reanudar la actividad sexual. Volver a la actividad sexual depende del riesgo de infeccin, la velocidad de la curacin y la comodidad y su deseo de Financial controller.  Trate de que alguien la ayude con las actividades del hogar y con el recin nacido al menos durante algunos das despus de salir del hospital.  Descanse todo lo que pueda. Trate de descansar o tomar una siesta mientras el beb est durmiendo.  Aumente sus actividades gradualmente.  Cumpla con todos los controles programados para despus del [place]. Es muy importante asistir a todas las visitas de Nurse, adult. En estas visitas, su mdico va a controlarla para asegurarse de que est sanando fsica y emocionalmente. SOLICITE ATENCIN MDICA SI:   Elimina cogulos grandes por la vagina. Guarde algunos cogulos para mostrarle al mdico.  Tiene una secrecin con feo olor que proviene de la vagina.  Tiene dificultad para orinar.  Orina con frecuencia.  Siente dolor al [employer].  Nota un cambio en sus movimientos intestinales.  Aumenta el enrojecimiento, el dolor o la hinchazn en la zona de la incisin.  Observa que supura pus en la incisin.  La incisin se abre.  Sus [employer] duelen, estn duras o enrojecidas.  Sufre un dolor intenso de [place].  Tiene visin borrosa o ve manchas.  Se siente triste o deprimida.  Tiene pensamientos acerca de lastimarse o daar al  recin nacido.  Tiene preguntas acerca de su cuidado, la atencin del recin nacido o acerca de los medicamentos.  Se siente mareada o sufre un desmayo.  Tiene una erupcin.  Siente dolor u observa enrojecimiento o hinchazn en el sitio en que  estaba la va intravenosa (IV).  Tiene nuseas o vmitos.  Usted dej de amamantar al beb y no ha tenido su perodo menstrual dentro de las 12 semanas siguientes.  No amamanta al beb y no tuvo su perodo menstrual en las ltimas 12 semanas.  Tiene fiebre. SOLICITE ATENCIN MDICA DE INMEDIATO SI:   Siente dolor persistente.  Siente dolor en el pecho.  Le falta el aire.  Se desmaya.  Siente dolor en la pierna.  Siente Research scientist (life sciences).  El sangrado vaginal satura dos o ms apsitos en 1 hora. ASEGRESE DE QUE:   Comprende estas instrucciones.  Controlar su enfermedad.  Recibir ayuda de inmediato si no mejora o si empeora.   Esta informacin no tiene Marine scientist el consejo del mdico. Asegrese de hacerle al mdico cualquier pregunta que tenga.   Document Released: 10/07/2005 Document Revised: 10/28/2014 Elsevier Interactive Patient Education 2016 [place] (Storing Breast Milk) La leche materna es un lquido vital que contiene clulas que combaten infecciones (anticuerpos). La [employer] se sac con anterioridad (extrajo) debe almacenarse de un cierto modo para que mantenga su eficacia en lo que concierne a la proteccin del beb contra las infecciones. Las siguientes pautas son para Actuary materna para un beb sano nacido a trmino.  [place]?  La leche puede almacenarse hasta 4horas a temperatura ambiente o a una temperatura de 52F a 62F (15,6C a 19,4C). Sin embargo, se la puede guardar durante 6 a 8horas si las piezas del sacaleches y los recipientes estn bien higienizados.  La leche puede almacenarse durante 3das en un refrigerador a menos de 43F (3,9C). Sin embargo, se la puede guardar durante 8das si las piezas del sacaleches y los recipientes estn bien higienizados.  La leche puede almacenarse durante 2semanas en un congelador  que se encuentra dentro de Geophysicist/field seismologist.  La leche puede almacenarse durante 3 o 70mses en un congelador con una puerta aparte.  La leche puede almacenarse durante 6 a 180mes en una cmara de congelacin a -28F (-20C). UnCarlota Raspberrye congelacin es un arcn congelador o un congelador independiente que no se abre con frecuencia y se mantiene a una temperatura inferior. [place] La leche materna puede almacenarse de la siguiente forma:  En un recipiente de vidrio.  En un envase de plstico rgido.  En unNicoletta Bae plstico diseada especialmente para alWater engineerMuchas mujeres prefieren esta opcin porque ocupa menos espacio y se puede coOptometristl sacaleches.  Almacene la leche en porciones de 2 a 4onzas (60 a 12046mpara que sea ms fcil descongelarla. Adems, esto es til para no desechar la lecWPS Resourcesb no toma.  Deje un espacio de aproximadamente una pulgada en la parte superior de la bolsa o el bibern para permitir que la lecGlenwood expanda a medida que se congela.  [place] cada recipiente con la fecha y la hora que se sac la leche para usarla en el orden en que se extrajo.  Si va a congelar la lecEnterpriseurdela en la parte posterior del congelador para evitar que los cambios de temperatura que  se producen al abrir la puerta de la unidad la afecten. CMO DESCONGELAR LA LECHE MATERNA CONGELADA  Se puede descongelar la leche materna de la siguiente forma:  En un refrigerador.  Debajo del chorro de agua tibia del grifo.  En un recipiente con agua tibia que haya sido calentada en la estufa.  No caliente la [employer] en la estufa o en el microondas ya que se destruirn algunas de sus propiedades para combatir infecciones.  Se puede almacenar la [employer] descongelada en un refrigerador durante 24horas como mximo, pero no se debe volver a Technical brewer.  Si desea agregar [employer] recientemente extrada a la [place], debe agregar menos cantidad de [place] de la que ya est congelada. Enfre la [employer] recientemente extrada en el refrigerador durante 74mnutos antes de agregrsela a la leche que est en eNurse, learning disability   Esta informacin no tiene cMarine scientistel consejo del mdico. Asegrese de hacerle al mdico cualquier pregunta que tenga.   Document Released: 09/25/2009 Document Revised: 10/12/2013 Elsevier Interactive Patient Education [employer]

## 2016-08-31 NOTE — Lactation Note (Signed)
This note was copied from a baby's chart. Lactation Consultation Note: Mother speaks Spanish. Father at bedside to interpret . Mother says infant is feeding well. She just breastfed infant and then she supplemented infant with formula.  Infant is lying in crib cuing. Mother picked infant up and latched infant to the (R) breast. Infant had wide open mouth with good depth.  Reviewed treatment to prevent severe engorgement and Mastitis Father of infant states that they are waiting to be discharged today.  Mother informed of breastfeeding resources, phone line, outpt and support groups and community support.  Patient Name: Valerie [name] [id number]'s Date: 08/31/2016 Reason for consult: Follow-up assessment   Maternal Data    Feeding Feeding Type: Breast Fed Length of feed: 20 min  LATCH Score/Interventions Latch: Grasps breast easily, tongue down, lips flanged, rhythmical sucking. Intervention(s): Adjust position;Breast massage;Breast compression  Audible Swallowing: Spontaneous and intermittent Intervention(s): Skin to skin  Type of Nipple: Everted at rest and after stimulation Intervention(s): Shells  Comfort (Breast/Nipple): Filling, red/small blisters or bruises, mild/mod discomfort  Problem noted: Filling;Mild/Moderate discomfort Interventions  (Cracked/bleeding/bruising/blister): Hand pump Interventions (Mild/moderate discomfort): Breast shields  Hold (Positioning): No assistance needed to correctly position infant at breast. Intervention(s): Position options  LATCH Score: 9  Lactation Tools Discussed/Used     Consult Status      Michel BickersKendrick, [name] 08/31/2016, 10:36 AM

## 2016-08-31 NOTE — Plan of Care (Signed)
Problem: Education: Goal: Knowledge of condition will improve Reviewed discharge information with patient and significant other with in house interpreter. Patient and family verbalize understanding.

## 2016-08-31 NOTE — Discharge Summary (Signed)
OB Discharge Summary     Patient Name: Valerie [name] DOB: 01-21-1986 MRN: [id number]  Date of admission: 08/29/2016 Delivering MD: Adam PhenixARNOLD, JAMES G   Date of discharge: 08/31/2016  Admitting diagnosis: cpt [id number] - REPEAT c-section Intrauterine pregnancy: 6122w0d     Secondary diagnosis:  Active Problems:   Status post repeat low transverse cesarean section Status post bilateral tubal ligation  Additional problems: None     Discharge diagnosis: Term Pregnancy Delivered                                                                                                Post partum procedures:Bilateral tubal ligation during cesarean  Augmentation: None  Complications: None  Hospital course:  Sceduled C/S   30 y.o. yo [id number] at 6722w0d was admitted to the hospital 08/29/2016 for scheduled cesarean section with the following indication:Elective Repeat.  Membrane Rupture Time/Date: 9:59 AM ,08/29/2016   Patient delivered a Viable infant.08/29/2016  Details of operation can be found in separate operative note.  Pateint had an uncomplicated postpartum course.  She is ambulating, tolerating a regular diet, passing flatus, had a BM, and urinating well. Patient is discharged home in stable condition on  08/31/16          Physical exam Vitals:   08/30/16 0415 08/30/16 1200 08/30/16 1750 08/31/16 0539  BP: (!) 91/46 102/63 (!) 112/50 (!) 103/44  Pulse: 62 (!) 59 72 65  Resp: 18 20 18 16   Temp: 98.1 F (36.7 C) 98.2 F (36.8 C) 98.1 F (36.7 C) 98.5 F (36.9 C)  TempSrc: Oral Oral  Oral  SpO2: 98%      General: alert, cooperative and no distress Lochia: appropriate Uterine Fundus: firm Incision: Healing well with no significant drainage, No significant erythema, Dressing is clean, dry, and intact DVT Evaluation: No evidence of DVT seen on physical exam. Negative Homan's sign. No cords or calf tenderness. Calf/Ankle edema is present bilaterally Labs: Lab Results  Component Value  Date   WBC 9.4 08/30/2016   HGB 10.1 (L) 08/30/2016   HCT 30.7 (L) 08/30/2016   MCV 77.3 (L) 08/30/2016   PLT 228 08/30/2016   No flowsheet data found.  Discharge instruction: per After Visit Summary and "Baby and Me Booklet".  After visit meds:    Medication List    TAKE these medications   acetaminophen 500 MG tablet Commonly known as:  TYLENOL Take 1,000 mg by mouth every 6 (six) hours as needed for mild pain, moderate pain or headache.   albuterol 108 (90 Base) MCG/ACT inhaler Commonly known as:  PROVENTIL HFA;VENTOLIN HFA Inhale 2 puffs into the lungs every 6 (six) hours as needed for wheezing or shortness of breath.   docusate sodium 100 MG capsule Commonly known as:  COLACE Take 1 capsule (100 mg total) by mouth 2 (two) times daily.   ibuprofen 600 MG tablet Commonly known as:  ADVIL,MOTRIN Take 1 tablet (600 mg total) by mouth every 6 (six) hours.   oxyCODONE-acetaminophen 5-325 MG tablet Commonly known as:  ROXICET Take 1 tablet by mouth every 4 (four) hours as  needed for severe pain.   Prenatal Vitamins 0.8 MG tablet Take 1 tablet by mouth daily.       Diet: routine diet  Activity: Advance as tolerated. Pelvic rest for 6 weeks.   Outpatient follow up:6 weeks Follow up Appt:No future appointments. Follow up Visit:No Follow-up on file.  Postpartum contraception: Tubal Ligation  Newborn Data: Live born female  Birth Weight: 7 lb 0.5 oz (3190 g) APGAR: 8, 10  Baby Feeding: Bottle and Breast Disposition:home with mother   08/31/2016 Jen MowElizabeth Mumaw, DO

## 2016-09-11 ENCOUNTER — Inpatient Hospital Stay (HOSPITAL_COMMUNITY)
Admission: AD | Admit: 2016-09-11 | Discharge: 2016-09-11 | Disposition: A | Discharge status: Home / Self Care | Payer: BLUE CROSS/BLUE SHIELD | Source: Ambulatory Visit | Attending: Obstetrics & Gynecology | Admitting: Obstetrics & Gynecology

## 2016-09-11 ENCOUNTER — Encounter (HOSPITAL_COMMUNITY): Payer: Self-pay

## 2016-09-11 DIAGNOSIS — N710 Acute inflammatory disease of uterus: Secondary | ICD-10-CM

## 2016-09-11 DIAGNOSIS — O9989 Other specified diseases and conditions complicating pregnancy, childbirth and the puerperium: Secondary | ICD-10-CM | POA: Diagnosis not present

## 2016-09-11 DIAGNOSIS — J45909 Unspecified asthma, uncomplicated: Secondary | ICD-10-CM | POA: Insufficient documentation

## 2016-09-11 DIAGNOSIS — Z9889 Other specified postprocedural states: Secondary | ICD-10-CM | POA: Diagnosis not present

## 2016-09-11 DIAGNOSIS — O34219 Maternal care for unspecified type scar from previous cesarean delivery: Secondary | ICD-10-CM | POA: Insufficient documentation

## 2016-09-11 DIAGNOSIS — Z79899 Other long term (current) drug therapy: Secondary | ICD-10-CM | POA: Insufficient documentation

## 2016-09-11 DIAGNOSIS — R109 Unspecified abdominal pain: Secondary | ICD-10-CM | POA: Diagnosis present

## 2016-09-11 LAB — URINALYSIS, ROUTINE W REFLEX MICROSCOPIC
BILIRUBIN URINE: NEGATIVE
Glucose, UA: NEGATIVE mg/dL
Ketones, ur: NEGATIVE mg/dL
Nitrite: NEGATIVE
PH: 5.5 (ref 5.0–8.0)
Protein, ur: NEGATIVE mg/dL
SPECIFIC GRAVITY, URINE: 1.02 (ref 1.005–1.030)

## 2016-09-11 LAB — URINE MICROSCOPIC-ADD ON: Bacteria, UA: NONE SEEN

## 2016-09-11 LAB — GC/CHLAMYDIA PROBE AMP (~~LOC~~) NOT AT ARMC
Chlamydia: NEGATIVE
Neisseria Gonorrhea: NEGATIVE

## 2016-09-11 MED ORDER — METRONIDAZOLE 500 MG PO TABS
500.0000 mg | ORAL_TABLET | Freq: Once | ORAL | Status: AC
  Administered 2016-09-11: 500 mg via ORAL
  Filled 2016-09-11: qty 1

## 2016-09-11 MED ORDER — CIPROFLOXACIN HCL 750 MG PO TABS
750.0000 mg | ORAL_TABLET | Freq: Once | ORAL | Status: AC
Start: 2016-09-11 — End: 2016-09-11
  Administered 2016-09-11: 750 mg via ORAL
  Filled 2016-09-11: qty 1

## 2016-09-11 MED ORDER — IBUPROFEN 600 MG PO TABS: 600.0000 mg | ORAL_TABLET | Freq: Four times a day (QID) | ORAL | 0 refills | Status: DC

## 2016-09-11 MED ORDER — KETOROLAC TROMETHAMINE 30 MG/ML IJ SOLN: 30.0000 mg | Freq: Once | INTRAMUSCULAR | Status: DC

## 2016-09-11 MED ORDER — IBUPROFEN 200 MG PO TABS: 600.0000 mg | ORAL_TABLET | Freq: Four times a day (QID) | ORAL | 0 refills | Status: DC | PRN

## 2016-09-11 MED ORDER — CIPROFLOXACIN HCL 750 MG PO TABS
750.0000 mg | ORAL_TABLET | Freq: Two times a day (BID) | ORAL | 0 refills | Status: AC
Start: 2016-09-11 — End: 2016-09-16

## 2016-09-11 MED ORDER — METRONIDAZOLE 500 MG PO TABS
500.0000 mg | ORAL_TABLET | Freq: Three times a day (TID) | ORAL | 0 refills | Status: AC
Start: 2016-09-11 — End: 2016-09-15

## 2016-09-11 MED ORDER — KETOROLAC TROMETHAMINE 60 MG/2ML IM SOLN
30.0000 mg | Freq: Once | INTRAMUSCULAR | Status: AC
  Administered 2016-09-11: 30 mg via INTRAMUSCULAR
  Filled 2016-09-11: qty 2

## 2016-09-11 NOTE — MAU Note (Signed)
Pt s/p C-section on 11/09, states tonight she turned over in the bed and had sudden onset of severe pain in her lower abd and lower back/

## 2016-09-11 NOTE — Discharge Instructions (Signed)
Endometritis (Endometritis) La endometritis es la irritacin, dolor e hinchazn (inflamacin) en la membrana que tapiza el tero (endometrio). CAUSAS  Infecciones bacterianas.  Enfermedades de transmisin sexual (ETS).  Haber sufrido un aborto o despus de un parto, especialmente despus de un trabajo de parto prolongado o una cesrea.  Ciertos procedimientos ginecolgicos (dilatacin y curetaje, histeroscopa o la insercin de un dispositivo anticonceptivo). SIGNOS Y SNTOMAS  Fiebre.  Dolor en la zona baja del abdomen o dolor plvico.  Secrecin o sangrado vaginal anormal.  Hinchazn abdominal o meteorismo (distensin).  Malestar general, sentirse enferma.  Molestias al mover el intestino. DIAGNSTICO Le indicarn un examen fsico y de la pelvis. Otras pruebas son:  Cultivos de material obtenido en el cuello del tero.  Anlisis de Hortonsangre.  Examen de Colombiauna muestra de tejido de las membranas que tapizan el tero (biopsia de endometrio).  Examen de las secreciones en el microscopio (preparado fresco).  Laparoscopa. TRATAMIENTO Generalmente se indican antibiticos. Otros tratamientos pueden ser:  Lquidos que se administran por la vena a travs de una va intravenosa IV.  Reposo. INSTRUCCIONES PARA EL CUIDADO EN EL HOGAR  Slo tome medicamentos de venta libre o recetados para Primary school teachercalmar el dolor, Environmental health practitionerel malestar o bajar la fiebre, segn las indicaciones de su mdico.  CenterPoint Energyome los antibiticos como se le indic. Tmelos todos, aunque se sienta mejor.  Puede reanudar su dieta y sus actividades normales segn se le haya indicado o segn su tolerancia.  No se haga duchas vaginales ni tenga relaciones sexuales hasta que el mdico la autorice.  No tenga relaciones sexuales hasta que su compaero haya sido tratado si la causa de la endometritis es una enfermedad de transmisin sexual. SOLICITE ATENCIN MDICA DE INMEDIATO SI:  Presenta hinchazn o aumento del dolor en el  abdomen.  Tiene fiebre.  Tiene una secrecin vaginal con mal olor, o ha aumentado la cantidad de Midwifesecrecin.  Brett Fairybserva una hemorragia vaginal anormal.  El medicamento no le Scientist, clinical (histocompatibility and immunogenetics)calma el dolor.  Tiene algn problema que puede relacionarse con el medicamento que est tomando.  Comienza a sentir nuseas y vmitos o no puede Comcastretener los alimentos.  Siente dolor al mover el intestino. ASEGRESE DE QUE:  Comprende estas instrucciones.  Controlar su afeccin.  Recibir ayuda de inmediato si no mejora o si empeora. Esta informacin no tiene Theme park managercomo fin reemplazar el consejo del mdico. Asegrese de hacerle al mdico cualquier pregunta que tenga. Document Released: 07/17/2005 Document Revised: 10/12/2013 Document Reviewed: 05/06/2013 Elsevier Interactive Patient Education  2017 [employer]

## 2016-09-11 NOTE — MAU Provider Note (Signed)
History     CSN: [id number]  Arrival date and time: 09/11/16 0400   First Provider Initiated Contact with Patient 09/11/16 0448      No chief complaint on file.  Patient is a 30 y/o G5P3 who delivered via repeat c-section on Novemeber 9th. She reports her post partum course had been uneventful until tonight when she had acute onset of severe abdominal pain. She reports it is anterior wrapping around to the back. She denies fever, chills, nausea or vomiting. She denies nay increase in her vaginal bleeding. She has no other complaints.     OB History    Gravida Para Term Preterm AB Living   5 4 3 1 1 3    SAB TAB Ectopic Multiple Live Births   1 0 0 0 3      Past Medical History:  Diagnosis Date  . Asthma   . PONV (postoperative nausea and vomiting)     Past Surgical History:  Procedure Laterality Date  . CESAREAN SECTION    . CESAREAN SECTION WITH BILATERAL TUBAL LIGATION Bilateral 08/29/2016   Procedure: CESAREAN SECTION WITH BILATERAL TUBAL LIGATION;  Surgeon: Adam PhenixJames G Arnold, MD;  [facility];  Service: Obstetrics;  Laterality: Bilateral;  . OVARIAN CYST REMOVAL      History reviewed. No pertinent family history.  Social History  Substance Use Topics  . Smoking status: Never Smoker  . Smokeless tobacco: Never Used  . Alcohol use No    Allergies: No Known Allergies  Prescriptions Prior to Admission  Medication Sig Dispense Refill Last Dose  . acetaminophen (TYLENOL) 500 MG tablet Take 1,000 mg by mouth every 6 (six) hours as needed for mild pain, moderate pain or headache.   Past Month at unknown  . albuterol (PROVENTIL HFA;VENTOLIN HFA) 108 (90 Base) MCG/ACT inhaler Inhale 2 puffs into the lungs every 6 (six) hours as needed for wheezing or shortness of breath. 1 Inhaler 2 Past Month at unknown  . docusate sodium (COLACE) 100 MG capsule Take 1 capsule (100 mg total) by mouth 2 (two) times daily. 30 capsule 0   . ibuprofen (ADVIL,MOTRIN) 600 MG  tablet Take 1 tablet (600 mg total) by mouth every 6 (six) hours. 30 tablet 0   . oxyCODONE-acetaminophen (ROXICET) 5-325 MG tablet Take 1 tablet by mouth every 4 (four) hours as needed for severe pain. 30 tablet 0   . Prenatal Multivit-Min-Fe-FA (PRENATAL VITAMINS) 0.8 MG tablet Take 1 tablet by mouth daily. 90 tablet 4 08/28/2016 at unknown    Review of Systems  Constitutional: Negative for chills and fever.  HENT: Negative for congestion.   Eyes: Negative for blurred vision and double vision.  Respiratory: Negative for cough and sputum production.   Cardiovascular: Negative for chest pain and palpitations.  Gastrointestinal: Positive for abdominal pain. Negative for constipation, diarrhea, heartburn, nausea and vomiting.  Genitourinary: Positive for dysuria. Negative for frequency and urgency.  Musculoskeletal: Negative for myalgias.  Skin: Negative for itching.  Neurological: Negative for dizziness and headaches.  Psychiatric/Behavioral: Negative for depression and suicidal ideas.   Physical Exam   Blood pressure 111/69, pulse 97, temperature 98.8 F (37.1 C), temperature source Oral, resp. rate 18, height 5\' 7"  (1.702 m), weight 203 lb (92.1 kg), last menstrual period 11/30/2015, SpO2 96 %, unknown if currently breastfeeding.  Physical Exam  Constitutional: She is oriented to person, place, and time. She appears well-developed. She appears distressed.  HENT:  Head: Normocephalic and atraumatic.  Cardiovascular: Normal rate and  intact distal pulses.   Respiratory: Effort normal and breath sounds normal.  GI: Soft. Bowel sounds are normal. She exhibits no distension. There is tenderness. There is no rebound and no guarding.  Neurological: She is alert and oriented to person, place, and time.  Skin: Skin is warm and dry.  Psychiatric: She has a normal mood and affect. Her behavior is normal.    MAU Course  Procedures  MDM IN [place] patient was given a dose of IM toradol for pain.  UA was preformed and showed moderate LE, no nitrates. Urine culture was sent. Concern for UTI verses endometritis. Patient prescribed 10 days of Ciprofloxacin and Flagyl. Given C-section and no increase in bleeding not suspicious for retained POC. If pain persists despite antibiotics would consider US. D/W Dr. Macon LargeAnyanwu  Assessment and Plan  1. Abdominal pain: concern for endometritis. Given 10 day course of ciprofloxacin and flagyl. Patients pain improved with toradol. Advised IBU at home. Prescription sent. Message sent to office to follow up in 1 wk or sooner if pain does not improve. Advised patient to take IBU 600mg  q6-8 hours scheduled for next 48 hours. Patient has prescription for Oxycodone. She can take that as need in addition to the IBU.  Ernestina Pennaicholas Schenk 09/11/2016, 5:05 AM

## 2016-09-11 NOTE — MAU Note (Signed)
Pt presents s/p c/s on 08/29/2016. Tonight while sleeping she woke with sudden, sharp abdominal pain that radiates to back. Pt states that she took oxycodone at 0300-did not help. Is having normal BM-last BM was yesterday. Bleeding is minimal. Denies urinary s/s.

## 2016-09-12 LAB — CULTURE, OB URINE

## 2016-09-26 ENCOUNTER — Encounter (HOSPITAL_COMMUNITY): Payer: Self-pay

## 2016-09-26 ENCOUNTER — Inpatient Hospital Stay (HOSPITAL_COMMUNITY)
Admission: AD | Admit: 2016-09-26 | Discharge: 2016-09-26 | Disposition: A | Discharge status: Home / Self Care | Payer: BLUE CROSS/BLUE SHIELD | Source: Ambulatory Visit | Attending: Obstetrics and Gynecology | Admitting: Obstetrics and Gynecology

## 2016-09-26 DIAGNOSIS — T814XXA Infection following a procedure, initial encounter: Secondary | ICD-10-CM

## 2016-09-26 DIAGNOSIS — O9089 Other complications of the puerperium, not elsewhere classified: Secondary | ICD-10-CM | POA: Diagnosis present

## 2016-09-26 DIAGNOSIS — O860 Infection of obstetric surgical wound: Secondary | ICD-10-CM | POA: Insufficient documentation

## 2016-09-26 DIAGNOSIS — IMO0001 Reserved for inherently not codable concepts without codable children: Secondary | ICD-10-CM

## 2016-09-26 LAB — URINALYSIS, ROUTINE W REFLEX MICROSCOPIC
Bacteria, UA: NONE SEEN
Bilirubin Urine: NEGATIVE
GLUCOSE, UA: NEGATIVE mg/dL
Ketones, ur: NEGATIVE mg/dL
Nitrite: NEGATIVE
PH: 5 (ref 5.0–8.0)
Protein, ur: NEGATIVE mg/dL
SPECIFIC GRAVITY, URINE: 1.015 (ref 1.005–1.030)

## 2016-09-26 MED ORDER — IBUPROFEN 800 MG PO TABS: 800.0000 mg | ORAL_TABLET | Freq: Three times a day (TID) | ORAL | 0 refills | Status: DC | PRN

## 2016-09-26 MED ORDER — HYDROCODONE-ACETAMINOPHEN 5-325 MG PO TABS
2.0000 | ORAL_TABLET | Freq: Once | ORAL | Status: AC
  Administered 2016-09-26: 2 via ORAL
  Filled 2016-09-26: qty 2

## 2016-09-26 MED ORDER — CEPHALEXIN 500 MG PO CAPS: 500.0000 mg | ORAL_CAPSULE | Freq: Four times a day (QID) | ORAL | 0 refills | Status: DC

## 2016-09-26 NOTE — MAU Provider Note (Signed)
History     CSN: [id number]  Arrival date and time: 09/26/16 [id number]   First Provider Initiated Contact with Patient 09/26/16 2059      Chief Complaint  Patient presents with  . Post-op Problem   Valerie [name] is a 30 y.o. [id number] who presents today with pain in her incision. She had a c-section on 08/29/16. She states that she has had two areas that were "infected", but she was seen for this problem. She states that she started having pain yesterday. She rates her pain 5/10 at this time. She has not taken any medication PTA.    Wound Check  She was originally treated more than 14 days ago. Prior ED Treatment: c-section  Her temperature was unmeasured prior to arrival. There has been colored discharge from the wound. There is new redness present. There is no swelling present. There is new pain present.    Past Medical History:  Diagnosis Date  . Asthma   . PONV (postoperative nausea and vomiting)     Past Surgical History:  Procedure Laterality Date  . CESAREAN SECTION    . CESAREAN SECTION WITH BILATERAL TUBAL LIGATION Bilateral 08/29/2016   Procedure: CESAREAN SECTION WITH BILATERAL TUBAL LIGATION;  Surgeon: Valerie PhenixJames G Arnold, MD;  [facility];  Service: Obstetrics;  Laterality: Bilateral;  . OVARIAN CYST REMOVAL      No family history on file.  Social History  Substance Use Topics  . Smoking status: Never Smoker  . Smokeless tobacco: Never Used  . Alcohol use No    Allergies: No Known Allergies  Prescriptions Prior to Admission  Medication Sig Dispense Refill Last Dose  . acetaminophen (TYLENOL) 500 MG tablet Take 1,000 mg by mouth every 6 (six) hours as needed for mild pain, moderate pain or headache.   Past Month at unknown  . albuterol (PROVENTIL HFA;VENTOLIN HFA) 108 (90 Base) MCG/ACT inhaler Inhale 2 puffs into the lungs every 6 (six) hours as needed for wheezing or shortness of breath. 1 Inhaler 2 Past Month at unknown  . docusate sodium  (COLACE) 100 MG capsule Take 1 capsule (100 mg total) by mouth 2 (two) times daily. 30 capsule 0   . ibuprofen (ADVIL,MOTRIN) 600 MG tablet Take 1 tablet (600 mg total) by mouth every 6 (six) hours. 30 tablet 0   . ibuprofen (MOTRIN IB) 200 MG tablet Take 3 tablets (600 mg total) by mouth every 6 (six) hours as needed for moderate pain. 30 tablet 0   . oxyCODONE-acetaminophen (ROXICET) 5-325 MG tablet Take 1 tablet by mouth every 4 (four) hours as needed for severe pain. 30 tablet 0   . Prenatal Multivit-Min-Fe-FA (PRENATAL VITAMINS) 0.8 MG tablet Take 1 tablet by mouth daily. 90 tablet 4 08/28/2016 at unknown    Review of Systems  Constitutional: Negative for chills and fever.  Gastrointestinal: Positive for blood in stool (Painful stools with some blood seen wiping. ) and constipation.  Genitourinary: Negative for dysuria, frequency and urgency.   Physical Exam   Blood pressure 106/60, pulse 62, temperature 98.2 F (36.8 C), temperature source Oral, resp. rate 18, SpO2 97 %, unknown if currently breastfeeding.  Physical Exam  Nursing note and vitals reviewed. Constitutional: She is oriented to person, place, and time. She appears well-developed and well-nourished. No distress.  HENT:  Head: Normocephalic.  Cardiovascular: Normal rate.   Respiratory: Effort normal.  GI: Soft. There is no tenderness. There is no rebound.    Incision mostly healed. One small area  on either side where there is some purulent discharge is expressed. On the left side there is a 2cmx2cm area of redness as well.   Neurological: She is alert and oriented to person, place, and time.  Skin: Skin is warm and dry.    MAU Course  Procedures  MDM  2115: D/W Dr. Vergie [name]. He will come evaluate the wound.  2151: Dr. Vergie [name] here to see the patient.  Assessment and Plan   1. Wound infection after surgery, initial encounter    DC home Comfort measures reviewed  RX: keflex QID x 7 days, ibuprofen 800mg  PRN  #30  Return to MAU as needed FU with OB as planned  Follow-up Information    [facility].   Specialty:  Obstetrics and Gynecology Contact information: [address] [place] [id number] [phone]           Valerie [name], Valerie [name] 09/26/2016, 9:01 PM

## 2016-09-26 NOTE — Discharge Instructions (Signed)
Preguntas frecuentes sobre infecciones en el lugar de la Leisure centre managerciruga (Surgical Site Infections FAQs) Qu es una infeccin en el lugar de la ciruga?  Una infeccin en el lugar de la ciruga es una infeccin en el rea del cuerpo donde se realiz la Azerbaijanciruga. La mayora de los pacientes no desarrollan una infeccin. Sin embargo, pueden ocurrir infecciones en 1a 3de cada 100pacientes sometidos a Chief Technology Officeruna ciruga. Algunos de los sntomas ms frecuentes de infeccin en el lugar de la ciruga son:  Enrojecimiento y Engineer, miningdolor alrededor del rea donde le realizaron la Leisure centre managerciruga.  Drenaje de un lquido turbio por la herida Barbadosquirrgica.  Fiebre Pueden tratarse las infecciones en el lugar de la ciruga?  S. La mayora de las infecciones en el lugar de la ciruga pueden tratarse con antibiticos. El antibitico que le indiquen depende de la bacteria (microbio) que causa la infeccin. A veces, los pacientes con infeccin en el lugar de la ciruga necesitan otra ciruga para tratar la infeccin. Qu hacen los hospitales para prevenir las infecciones en el lugar de la ciruga?  Para prevenir las infecciones en el lugar de la ciruga, los mdicos, los enfermeros y los dems profesionales:  Se lavan las manos y los brazos Lubrizol Corporationhasta los codos con un agente antisptico antes de la Azerbaijanciruga.  Se lavan las manos con agua y [place]jabn o con un desinfectante para manos a base de alcohol antes y despus de atender a Sport and exercise psychologistcada paciente.  Pueden quitarle el vello antes de la ciruga con una afeitadora elctrica si tiene vello en el rea donde se realizar la Leisure centre managerciruga. No deben usar Radio produceruna hoja de afeitar.  Usan cofias para cubrir el cabello, Ocontomscaras, batas y guantes durante la ciruga para Pharmacologistmantener el rea de la ciruga limpia.  Le administrarn antibiticos antes de Curatorcomenzar la ciruga. En la International Business Machinesmayora de los casos, los pacientes debern tomar sus antibiticos unos 60minutos antes de que empiece la ciruga y dejarlos en el plazo de 24horas  despus de la Azerbaijanciruga.  Lave la herida quirrgica con un jabn especial que destruye las bacterias. Qu puedo hacer para prevenir las infecciones en el lugar de la ciruga?  Antes de la ciruga:   Comunique al mdico cualquier otro problema de salud que tenga. Algunas afecciones como la [place] Lakealergia, diabetes y obesidad podran afectar la Azerbaijanciruga y [place]el tratamiento.  Deje de fumar. Los pacientes que fuman sufren ms infecciones. Hable con su mdico sobre cmo puede dejar de fumar antes de la Azerbaijanciruga.  No se rasure la zona donde tendr Occidental Petroleumla ciruga. El rasurado con hoja de Radio producerafeitar puede irritar la piel y Air traffic controllerfavorecer el desarrollo de una infeccin. Al momento de la ciruga:   Avise si alguien trata de rasurarlo antes de la Azerbaijanciruga. Pregunte por qu quieren rasurarlo y hable con el cirujano si tiene Jerseyalguna duda.  Pregunte si deber recibir antibiticos antes de la ciruga. Despus de la ciruga:   Asegrese de que los mdicos que lo atienden se laven las manos antes de [place] Cityexaminarlo, ya sea con agua y Belarusjabn o con un desinfectante para manos a base de alcohol.  Si nota que los mdicos no se higienizan las manos, pdales que lo hagan.  Los familiares o amigos que lo visiten no deben tocarle la herida o los vendajes.  Los familiares y amigos deben lavarse las manos con agua y [place]jabn o con un desinfectante para manos a base de alcohol antes y despus de visitarlo. Si nota que no se higienizan las manos, pdales que lo hagan.  Qu tengo que hacer cuando salga del hospital y vuelva a casa?  Antes de volver a su casa, el mdico o el enfermero deben explicarle todo lo que necesita saber sobre el cuidado de la herida. Asegrese de entender cmo cuidar de su herida antes de salir del hospital.  Siempre lvese bien las manos antes y despus de curar su herida.  Antes de retirarse, asegrese de saber con quin comunicarse si tiene dudas o surgen problemas cuando se encuentre en su casa.  Si tiene sntomas de  infeccin, como enrojecimiento y Art therapistdolor en el lugar de la ciruga, drenaje o Whipholtfiebre, comunquese inmediatamente con su mdico. Si tiene Azerbaijanalguna otra duda, consulte a su mdico o enfermero.  Desarrollado y patrocinado en conjunto por la Sociedad Air traffic controllerAmericana de Teaching laboratory technicianpidemiologa Hospitalaria (The Society for Healthcare Epidemiology of [place], [place]HEA); la Sociedad Estadounidense de SelinsgroveEnfermedades Infecciosas (Infectious Diseases Society of [place], IDSA); Chief Operating Officerla Asociacin Americana de Hospitales [facility](American Hospital Association); la Asociacin de Profesionales de Control de Infecciones y Marine scientistpidemiologa (Association for Professionals in Infection Control and Epidemiology, APIC); American Standard Companiesel Centro para Air traffic controllerel Control y Psychiatristla Prevencin de Event organisernfermedades (Center for Disease Control and Prevention, CDC); y la Comisin Programmer, multimediaConjunta (The TXU CorpJoint Commission).  Esta informacin no tiene Theme park managercomo fin reemplazar el consejo del mdico. Asegrese de hacerle al mdico cualquier pregunta que tenga. Document Released: 10/12/2013 Document Revised: 10/28/2014 Document Reviewed: 02/26/2016 Elsevier Interactive Patient Education  2017 [employer]vier Inc[employer]

## 2016-09-26 NOTE — MAU Note (Signed)
Pt reports C/S 11/09, and now she has noticed 3 small holes in her incision and there is drainage from the area. Also has noted blood when she has a BM and there is a lot of pain then.

## 2016-10-01 ENCOUNTER — Ambulatory Visit (INDEPENDENT_AMBULATORY_CARE_PROVIDER_SITE_OTHER): Payer: BLUE CROSS/BLUE SHIELD | Admitting: Advanced Practice Midwife

## 2016-10-01 VITALS — BP 109/48 | HR 57 | Wt 204.0 lb

## 2016-10-01 DIAGNOSIS — Z98891 History of uterine scar from previous surgery: Secondary | ICD-10-CM

## 2016-10-01 NOTE — Progress Notes (Signed)
Subjective:     Valerie [name] is a 30 y.o. female who presents for a postpartum visit. She is 5 weeks postpartum following a low cervical transverse Cesarean section. I have fully reviewed the prenatal and intrapartum course. The delivery was at 39 gestational weeks. Outcome: repeat cesarean section, low transverse incision. Anesthesia: epidural and spinal. Postpartum course has been uneventful. Baby's course has been uneventful. Baby is feeding by both breast and bottle - Similac Advance. Bleeding no bleeding. Bowel function is normal. Bladder function is normal. Patient is sexually active. Contraception method is tubal ligation. Postpartum depression screening: negative.  The following portions of the patient's history were reviewed and updated as appropriate: allergies, current medications, past family history, past medical history, past social history, past surgical history and problem list.  Review of Systems Pertinent items are noted in HPI.   Objective:    There were no vitals taken for this visit.  General:  alert, cooperative and no distress   Breasts:  inspection negative, no nipple discharge or bleeding, no masses or nodularity palpable  Lungs: clear to auscultation bilaterally  Heart:  regular rate and rhythm, S1, S2 normal, no murmur, click, rub or gallop  Abdomen: soft, non-tender; bowel sounds normal; no masses,  no organomegaly and Wound healing well   Vulva:  not evaluated  Vagina: not evaluated  Cervix:  n/a  Corpus: not examined  Adnexa:  not evaluated  Rectal Exam: Not performed.          Assessment:     Normal postoperative/ postpartum exam. Pap smear not done at today's visit.   Plan:    1. Contraception: tubal ligation 2. Reassured incision is healing well 3. Follow up in: 1 year or as needed.

## 2016-10-01 NOTE — Patient Instructions (Signed)
Laceration Care, Adult Introduction A laceration is a cut that goes through all layers of the skin. The cut also goes into the tissue that is right under the skin. Some cuts heal on their own. Others need to be closed with stitches (sutures), staples, skin adhesive strips, or wound glue. Taking care of your cut lowers your risk of infection and helps your cut to heal better. How to take care of your cut For stitches or staples:  Keep the wound clean and dry.  If you were given a bandage (dressing), you should change it at least one time per day or as told by your doctor. You should also change it if it gets wet or dirty.  Keep the wound completely dry for the first 24 hours or as told by your doctor. After that time, you may take a shower or a bath. However, make sure that the wound is not soaked in water until after the stitches or staples have been removed.  Clean the wound one time each day or as told by your doctor:  Wash the wound with soap and water.  Rinse the wound with water until all of the soap comes off.  Pat the wound dry with a clean towel. Do not rub the wound.  After you clean the wound, put a thin layer of antibiotic ointment on it as told by your doctor. This ointment:  Helps to prevent infection.  Keeps the bandage from sticking to the wound.  Have your stitches or staples removed as told by your doctor. If your doctor used skin adhesive strips:  Keep the wound clean and dry.  If you were given a bandage, you should change it at least one time per day or as told by your doctor. You should also change it if it gets dirty or wet.  Do not get the skin adhesive strips wet. You can take a shower or a bath, but be careful to keep the wound dry.  If the wound gets wet, pat it dry with a clean towel. Do not rub the wound.  Skin adhesive strips fall off on their own. You can trim the strips as the wound heals. Do not remove any strips that are still stuck to the wound.  They will fall off after a while. If your doctor used wound glue:  Try to keep your wound dry, but you may briefly wet it in the shower or bath. Do not soak the wound in water, such as by swimming.  After you take a shower or a bath, gently pat the wound dry with a clean towel. Do not rub the wound.  Do not do any activities that will make you really sweaty until the skin glue has fallen off on its own.  Do not apply liquid, cream, or ointment medicine to your wound while the skin glue is still on.  If you were given a bandage, you should change it at least one time per day or as told by your doctor. You should also change it if it gets dirty or wet.  If a bandage is placed over the wound, do not let the tape for the bandage touch the skin glue.  Do not pick at the glue. The skin glue usually stays on for 5-10 days. Then, it falls off of the skin. General Instructions  To help prevent scarring, make sure to cover your wound with sunscreen whenever you are outside after stitches are removed, after adhesive strips are removed,   or when wound glue stays in place and the wound is healed. Make sure to wear a sunscreen of at least 30 SPF.  Take over-the-counter and prescription medicines only as told by your doctor.  If you were given antibiotic medicine or ointment, take or apply it as told by your doctor. Do not stop using the antibiotic even if your wound is getting better.  Do not scratch or pick at the wound.  Keep all follow-up visits as told by your doctor. This is important.  Check your wound every day for signs of infection. Watch for:  Redness, swelling, or pain.  Fluid, blood, or pus.  Raise (elevate) the injured area above the level of your heart while you are sitting or lying down, if possible. Get help if:  You got a tetanus shot and you have any of these problems at the injection site:  Swelling.  Very bad pain.  Redness.  Bleeding.  You have a fever.  A wound  that was closed breaks open.  You notice a bad smell coming from your wound or your bandage.  You notice something coming out of the wound, such as wood or glass.  Medicine does not help your pain.  You have more redness, swelling, or pain at the site of your wound.  You have fluid, blood, or pus coming from your wound.  You notice a change in the color of your skin near your wound.  You need to change the bandage often because fluid, blood, or pus is coming from the wound.  You start to have a new rash.  You start to have numbness around the wound. Get help right away if:  You have very bad swelling around the wound.  Your pain suddenly gets worse and is very bad.  You notice painful lumps near the wound or on skin that is anywhere on your body.  You have a red streak going away from your wound.  The wound is on your hand or foot and you cannot move a finger or toe like you usually can.  The wound is on your hand or foot and you notice that your fingers or toes look pale or bluish. This information is not intended to replace advice given to you by your health care provider. Make sure you discuss any questions you have with your health care provider. Document Released: 03/25/2008 Document Revised: 03/14/2016 Document Reviewed: 10/03/2014  2017 Elsevier  

## 2016-10-01 NOTE — Progress Notes (Signed)
Pacific Interpreter # [phone]

## 2017-06-21 ENCOUNTER — Emergency Department (HOSPITAL_COMMUNITY)
Admission: EM | Admit: 2017-06-21 | Discharge: 2017-06-21 | Disposition: A | Discharge status: Home / Self Care | Payer: BLUE CROSS/BLUE SHIELD | Source: Home / Self Care | Attending: Emergency Medicine | Admitting: Emergency Medicine

## 2017-06-21 ENCOUNTER — Emergency Department (HOSPITAL_COMMUNITY): Payer: BLUE CROSS/BLUE SHIELD

## 2017-06-21 ENCOUNTER — Encounter (HOSPITAL_COMMUNITY): Payer: Self-pay | Admitting: Emergency Medicine

## 2017-06-21 DIAGNOSIS — R1011 Right upper quadrant pain: Secondary | ICD-10-CM | POA: Insufficient documentation

## 2017-06-21 DIAGNOSIS — J45909 Unspecified asthma, uncomplicated: Secondary | ICD-10-CM

## 2017-06-21 DIAGNOSIS — Z79899 Other long term (current) drug therapy: Secondary | ICD-10-CM

## 2017-06-21 DIAGNOSIS — K8020 Calculus of gallbladder without cholecystitis without obstruction: Secondary | ICD-10-CM

## 2017-06-21 LAB — URINALYSIS, ROUTINE W REFLEX MICROSCOPIC
BILIRUBIN URINE: NEGATIVE
Glucose, UA: NEGATIVE mg/dL
HGB URINE DIPSTICK: NEGATIVE
KETONES UR: NEGATIVE mg/dL
Nitrite: POSITIVE — AB
Protein, ur: NEGATIVE mg/dL
Specific Gravity, Urine: 1.027 (ref 1.005–1.030)
pH: 5 (ref 5.0–8.0)

## 2017-06-21 LAB — COMPREHENSIVE METABOLIC PANEL
ALT: 15 U/L (ref 14–54)
ANION GAP: 8 (ref 5–15)
AST: 19 U/L (ref 15–41)
Albumin: 3.9 g/dL (ref 3.5–5.0)
Alkaline Phosphatase: 64 U/L (ref 38–126)
BUN: 10 mg/dL (ref 6–20)
CHLORIDE: 105 mmol/L (ref 101–111)
CO2: 22 mmol/L (ref 22–32)
CREATININE: 0.69 mg/dL (ref 0.44–1.00)
Calcium: 8.7 mg/dL — ABNORMAL LOW (ref 8.9–10.3)
Glucose, Bld: 131 mg/dL — ABNORMAL HIGH (ref 65–99)
Potassium: 3.9 mmol/L (ref 3.5–5.1)
SODIUM: 135 mmol/L (ref 135–145)
Total Bilirubin: 0.3 mg/dL (ref 0.3–1.2)
Total Protein: 7.2 g/dL (ref 6.5–8.1)

## 2017-06-21 LAB — LIPASE, BLOOD: LIPASE: 31 U/L (ref 11–51)

## 2017-06-21 LAB — CBC
HCT: 26.7 % — ABNORMAL LOW (ref 36.0–46.0)
HEMOGLOBIN: 7.9 g/dL — AB (ref 12.0–15.0)
MCH: 20.7 pg — AB (ref 26.0–34.0)
MCHC: 29.6 g/dL — ABNORMAL LOW (ref 30.0–36.0)
MCV: 70.1 fL — AB (ref 78.0–100.0)
PLATELETS: 399 10*3/uL (ref 150–400)
RBC: 3.81 MIL/uL — AB (ref 3.87–5.11)
RDW: 15.8 % — ABNORMAL HIGH (ref 11.5–15.5)
WBC: 8.2 10*3/uL (ref 4.0–10.5)

## 2017-06-21 LAB — I-STAT BETA HCG BLOOD, ED (MC, WL, AP ONLY)

## 2017-06-21 MED ORDER — KETOROLAC TROMETHAMINE 30 MG/ML IJ SOLN
15.0000 mg | Freq: Once | INTRAMUSCULAR | Status: AC
  Administered 2017-06-21: 15 mg via INTRAVENOUS
  Filled 2017-06-21: qty 1

## 2017-06-21 MED ORDER — ACETAMINOPHEN 500 MG PO TABS
1000.0000 mg | ORAL_TABLET | Freq: Once | ORAL | Status: AC
  Administered 2017-06-21: 1000 mg via ORAL
  Filled 2017-06-21: qty 2

## 2017-06-21 MED ORDER — MORPHINE SULFATE (PF) 4 MG/ML IV SOLN
8.0000 mg | Freq: Once | INTRAVENOUS | Status: AC
  Administered 2017-06-21: 8 mg via INTRAVENOUS
  Filled 2017-06-21: qty 2

## 2017-06-21 MED ORDER — ONDANSETRON HCL 4 MG/2ML IJ SOLN
4.0000 mg | Freq: Once | INTRAMUSCULAR | Status: AC | PRN
  Administered 2017-06-21: 4 mg via INTRAVENOUS
  Filled 2017-06-21: qty 2

## 2017-06-21 MED ORDER — MORPHINE SULFATE (PF) 4 MG/ML IV SOLN
4.0000 mg | Freq: Once | INTRAVENOUS | Status: AC
  Administered 2017-06-21: 4 mg via INTRAVENOUS
  Filled 2017-06-21: qty 1

## 2017-06-21 MED ORDER — GI COCKTAIL ~~LOC~~
30.0000 mL | Freq: Once | ORAL | Status: AC
  Administered 2017-06-21: 30 mL via ORAL
  Filled 2017-06-21: qty 30

## 2017-06-21 MED ORDER — OXYCODONE HCL 5 MG PO TABS
5.0000 mg | ORAL_TABLET | Freq: Once | ORAL | Status: AC
  Administered 2017-06-21: 5 mg via ORAL
  Filled 2017-06-21: qty 1

## 2017-06-21 MED ORDER — ONDANSETRON HCL 4 MG/2ML IJ SOLN
4.0000 mg | Freq: Once | INTRAMUSCULAR | Status: AC
  Administered 2017-06-21: 4 mg via INTRAVENOUS
  Filled 2017-06-21: qty 2

## 2017-06-21 MED ORDER — ONDANSETRON 4 MG PO TBDP: 4.0000 mg | ORAL_TABLET | Freq: Three times a day (TID) | ORAL | 0 refills | Status: DC | PRN

## 2017-06-21 MED ORDER — FOSFOMYCIN TROMETHAMINE 3 G PO PACK
3.0000 g | PACK | Freq: Once | ORAL | Status: AC
  Administered 2017-06-21: 3 g via ORAL
  Filled 2017-06-21: qty 3

## 2017-06-21 MED ORDER — MORPHINE SULFATE 15 MG PO TABS: 15.0000 mg | ORAL_TABLET | ORAL | 0 refills | Status: DC | PRN

## 2017-06-21 MED ORDER — SODIUM CHLORIDE 0.9 % IV BOLUS (SEPSIS)
1000.0000 mL | Freq: Once | INTRAVENOUS | Status: AC
  Administered 2017-06-21: 1000 mL via INTRAVENOUS

## 2017-06-21 NOTE — ED Notes (Signed)
Patient transported to [facility] 

## 2017-06-21 NOTE — ED Provider Notes (Signed)
[facility]-EMERGENCY DEPT Provider Note   CSN: [id number] Arrival date & time: 06/21/17  1930     History   Chief Complaint Chief Complaint  Patient presents with  . Abdominal Pain  . Back Pain    HPI Valerie [name] is a 31 y.o. female.  31 yo F with a chief complaint of epigastric abdominal pain. Started about 30 minutes ago. Nausea but denies vomiting. No association with food. No suspicious food intake. Denies diarrhea. Denies trauma. Denies fevers. History of 3 C-sections in the past. Denies surgery. Colicky in nature.    The history is provided by the patient.  Abdominal Pain   This is a new problem. The current episode started less than 1 hour ago. The problem occurs constantly. The problem has not changed since onset.The pain is associated with an unknown factor. The pain is located in the epigastric region. The quality of the pain is sharp and shooting. The pain is at a severity of 9/10. The pain is severe. Associated symptoms include nausea. Pertinent negatives include fever, vomiting, dysuria, headaches, arthralgias and myalgias. Nothing aggravates the symptoms. Nothing relieves the symptoms.  Back Pain   Associated symptoms include abdominal pain. Pertinent negatives include no chest pain, no fever, no headaches and no dysuria.    Past Medical History:  Diagnosis Date  . Asthma   . PONV (postoperative nausea and vomiting)     Patient Active Problem List   Diagnosis Date Noted  . Status post repeat low transverse cesarean section 08/31/2016  . History of unexplained stillbirth 08/22/2016  . Sterilization consult 05/27/2016  . Asthma affecting pregnancy, antepartum 04/29/2016  . Supervision of high risk pregnancy, antepartum 04/29/2016  . Group B Streptococcus carrier, +RV culture, currently pregnant 04/29/2016  . UTI in pregnancy 04/29/2016  . History of 2 cesarean sections 04/29/2016    Past Surgical History:  Procedure Laterality Date  . CESAREAN SECTION      . CESAREAN SECTION WITH BILATERAL TUBAL LIGATION Bilateral 08/29/2016   Procedure: CESAREAN SECTION WITH BILATERAL TUBAL LIGATION;  Surgeon: Adam Phenix, MD;  Location: [facility] BIRTHING SUITES;  Service: Obstetrics;  Laterality: Bilateral;  . OVARIAN CYST REMOVAL      OB History    Gravida Para Term Preterm AB Living   5 4 3 1 1 3    SAB TAB Ectopic Multiple Live Births   1 0 0 0 3       Home Medications    Prior to Admission medications   Medication Sig Start Date End Date Taking? Authorizing Provider  cephALEXin (KEFLEX) 500 MG capsule Take 1 capsule (500 mg total) by mouth 4 (four) times daily. 09/26/16   Armando Reichert, CNM  ibuprofen (ADVIL,MOTRIN) 800 MG tablet Take 1 tablet (800 mg total) by mouth every 8 (eight) hours as needed. 09/26/16   Armando Reichert, CNM  morphine (MSIR) 15 MG tablet Take 1 tablet (15 mg total) by mouth every 4 (four) hours as needed for severe pain. 06/21/17   Melene Plan, DO  ondansetron (ZOFRAN ODT) 4 MG disintegrating tablet Take 1 tablet (4 mg total) by mouth every 8 (eight) hours as needed for nausea or vomiting. 06/21/17   Melene Plan, DO  Prenatal Multivit-Min-Fe-FA (PRENATAL VITAMINS) 0.8 MG tablet Take 1 tablet by mouth daily. 04/29/16   Wouk, Wilfred Curtis, MD    Family History No family history on file.  Social History Social History  Substance Use Topics  . Smoking status: Never Smoker  . Smokeless  tobacco: Never Used  . Alcohol use No     Allergies   Patient has no known allergies.   Review of Systems Review of Systems  Constitutional: Negative for chills and fever.  HENT: Negative for congestion and rhinorrhea.   Eyes: Negative for redness and visual disturbance.  Respiratory: Negative for shortness of breath and wheezing.   Cardiovascular: Negative for chest pain and palpitations.  Gastrointestinal: Positive for abdominal pain and nausea. Negative for vomiting.  Genitourinary: Negative for dysuria and urgency.  Musculoskeletal:  Positive for back pain. Negative for arthralgias and myalgias.  Skin: Negative for pallor and wound.  Neurological: Negative for dizziness and headaches.     Physical Exam Updated Vital Signs BP (!) 99/49   Pulse 66   Temp 98.1 F (36.7 C) (Oral)   Resp 16   LMP 06/05/2017   SpO2 100%   Physical Exam  Constitutional: She is oriented to person, place, and time. She appears well-developed and well-nourished. No distress.  HENT:  Head: Normocephalic and atraumatic.  Eyes: Pupils are equal, round, and reactive to light. EOM are normal.  Neck: Normal range of motion. Neck supple.  Cardiovascular: Normal rate and regular rhythm.  Exam reveals no gallop and no friction rub.   No murmur heard. Pulmonary/Chest: Effort normal. She has no wheezes. She has no rales.  Abdominal: Soft. She exhibits no distension. There is tenderness (RUQ). There is guarding.  Negative murphys  Musculoskeletal: She exhibits no edema or tenderness.  Neurological: She is alert and oriented to person, place, and time.  Skin: Skin is warm and dry. She is not diaphoretic.  Psychiatric: She has a normal mood and affect. Her behavior is normal.  Nursing note and vitals reviewed.    ED Treatments / Results  Labs (all labs ordered are listed, but only abnormal results are displayed) Labs Reviewed  COMPREHENSIVE METABOLIC PANEL - Abnormal; Notable for the following:       Result Value   Glucose, Bld 131 (*)    Calcium 8.7 (*)    All other components within normal limits  CBC - Abnormal; Notable for the following:    RBC 3.81 (*)    Hemoglobin 7.9 (*)    HCT 26.7 (*)    MCV 70.1 (*)    MCH 20.7 (*)    MCHC 29.6 (*)    RDW 15.8 (*)    All other components within normal limits  URINALYSIS, ROUTINE W REFLEX MICROSCOPIC - Abnormal; Notable for the following:    APPearance HAZY (*)    Nitrite POSITIVE (*)    Leukocytes, UA TRACE (*)    Bacteria, UA RARE (*)    Squamous Epithelial / LPF 6-30 (*)    All  other components within normal limits  LIPASE, BLOOD  I-STAT BETA HCG BLOOD, ED ([facility], [facility], [facility] ONLY)    EKG  EKG Interpretation None       Radiology US Abdomen Limited Ruq  Result Date: 06/21/2017 CLINICAL DATA:  Right upper quadrant pain tonight EXAM: ULTRASOUND ABDOMEN LIMITED RIGHT UPPER QUADRANT COMPARISON:  None. FINDINGS: Gallbladder: Multiple stones measuring up to 4 mm. No gallbladder mural thickening or pericholecystic fluid. No tenderness to probe pressure over the gallbladder. Common bile duct: Diameter: 2.1 mm Liver: No focal lesion identified. Within normal limits in parenchymal echogenicity. Portal vein is patent on color Doppler imaging with normal direction of blood flow towards the liver. IMPRESSION: Cholelithiasis.  No ultrasound evidence of acute cholecystitis. Electronically Signed   By:  Ellery Plunk M.D.   On: 06/21/2017 22:32    Procedures Procedures (including critical care time)  Medications Ordered in ED Medications  fosfomycin (MONUROL) packet 3 g (not administered)  ondansetron (ZOFRAN) injection 4 mg (4 mg Intravenous Given 06/21/17 2009)  morphine 4 MG/ML injection 4 mg (4 mg Intravenous Given 06/21/17 2014)  gi cocktail (Maalox,Lidocaine,Donnatal) (30 mLs Oral Given 06/21/17 2014)  sodium chloride 0.9 % bolus 1,000 mL (0 mLs Intravenous Stopped 06/21/17 2152)  morphine 4 MG/ML injection 8 mg (8 mg Intravenous Given 06/21/17 2037)  oxyCODONE (Oxy IR/ROXICODONE) immediate release tablet 5 mg (5 mg Oral Given 06/21/17 2255)  acetaminophen (TYLENOL) tablet 1,000 mg (1,000 mg Oral Given 06/21/17 2256)  ketorolac (TORADOL) 30 MG/ML injection 15 mg (15 mg Intravenous Given 06/21/17 2256)  ondansetron (ZOFRAN) injection 4 mg (4 mg Intravenous Given 06/21/17 2256)     Initial Impression / Assessment and Plan / ED Course  I have reviewed the triage vital signs and the nursing notes.  Pertinent labs & imaging results that were available during my care of the patient were  reviewed by me and considered in my medical decision making (see chart for details).     31 yo F with a cc of abdominal pain. Acutely over 30  Min.  Pain worst to the epigastrium but also in RUQ.  Will [place].   [place] with cholelithiasis without cholecystitis.  Tolerating PO.  Pain significantly improved. UA with UTI.  No urinary symptoms, given fosfomycin.  Given surgical follow up.   11:07 PM:  I have discussed the diagnosis/risks/treatment options with the patient and family and believe the pt to be eligible for discharge home to follow-up with Surgery. We also discussed returning to the ED immediately if new or worsening sx occur. We discussed the sx which are most concerning (e.g., sudden worsening pain, fever, inability to tolerate by mouth) that necessitate immediate return. Medications administered to the patient during their visit and any new prescriptions provided to the patient are listed below.  Medications given during this visit Medications  fosfomycin (MONUROL) packet 3 g (not administered)  ondansetron (ZOFRAN) injection 4 mg (4 mg Intravenous Given 06/21/17 2009)  morphine 4 MG/ML injection 4 mg (4 mg Intravenous Given 06/21/17 2014)  gi cocktail (Maalox,Lidocaine,Donnatal) (30 mLs Oral Given 06/21/17 2014)  sodium chloride 0.9 % bolus 1,000 mL (0 mLs Intravenous Stopped 06/21/17 2152)  morphine 4 MG/ML injection 8 mg (8 mg Intravenous Given 06/21/17 2037)  oxyCODONE (Oxy IR/ROXICODONE) immediate release tablet 5 mg (5 mg Oral Given 06/21/17 2255)  acetaminophen (TYLENOL) tablet 1,000 mg (1,000 mg Oral Given 06/21/17 2256)  ketorolac (TORADOL) 30 MG/ML injection 15 mg (15 mg Intravenous Given 06/21/17 2256)  ondansetron (ZOFRAN) injection 4 mg (4 mg Intravenous Given 06/21/17 2256)     The patient appears reasonably screen and/or stabilized for discharge and I doubt any other medical condition or other [facility] requiring further screening, evaluation, or treatment in the ED at this time prior to  discharge.    Final Clinical Impressions(s) / ED Diagnoses   Final diagnoses:  RUQ abdominal pain  Calculus of gallbladder without cholecystitis without obstruction    New Prescriptions New Prescriptions   MORPHINE (MSIR) 15 MG TABLET    Take 1 tablet (15 mg total) by mouth every 4 (four) hours as needed for severe pain.   ONDANSETRON (ZOFRAN ODT) 4 MG DISINTEGRATING TABLET    Take 1 tablet (4 mg total) by mouth every 8 (eight) hours as  needed for nausea or vomiting.     Melene PlanFloyd, Chrystle Murillo, DO 06/21/17 2307

## 2017-06-21 NOTE — ED Notes (Signed)
MD Floyd at bedside  

## 2017-06-21 NOTE — Discharge Instructions (Signed)
Try to avoid fatty foods. Return for sudden worsening pain, fever, inability to eat or drink.

## 2017-06-21 NOTE — ED Triage Notes (Signed)
Patient reports mid/epigastric pain radiating to mid back onset this evening with nausea , denies emesis , no diarrhea or fever .

## 2017-06-22 ENCOUNTER — Inpatient Hospital Stay (HOSPITAL_COMMUNITY)
Admission: EM | Admit: 2017-06-22 | Discharge: 2017-06-24 | DRG: 419 | Disposition: A | Discharge status: Home / Self Care | Payer: BLUE CROSS/BLUE SHIELD | Attending: General Surgery | Admitting: General Surgery

## 2017-06-22 ENCOUNTER — Encounter (HOSPITAL_COMMUNITY): Payer: Self-pay | Admitting: Emergency Medicine

## 2017-06-22 DIAGNOSIS — Z6834 Body mass index (BMI) 34.0-34.9, adult: Secondary | ICD-10-CM

## 2017-06-22 DIAGNOSIS — K8010 Calculus of gallbladder with chronic cholecystitis without obstruction: Principal | ICD-10-CM | POA: Diagnosis present

## 2017-06-22 DIAGNOSIS — E669 Obesity, unspecified: Secondary | ICD-10-CM | POA: Diagnosis present

## 2017-06-22 DIAGNOSIS — R1011 Right upper quadrant pain: Secondary | ICD-10-CM | POA: Diagnosis not present

## 2017-06-22 DIAGNOSIS — D649 Anemia, unspecified: Secondary | ICD-10-CM | POA: Diagnosis present

## 2017-06-22 DIAGNOSIS — J45909 Unspecified asthma, uncomplicated: Secondary | ICD-10-CM | POA: Diagnosis present

## 2017-06-22 DIAGNOSIS — K8020 Calculus of gallbladder without cholecystitis without obstruction: Secondary | ICD-10-CM

## 2017-06-22 LAB — COMPREHENSIVE METABOLIC PANEL
ALK PHOS: 61 U/L (ref 38–126)
ALT: 15 U/L (ref 14–54)
AST: 18 U/L (ref 15–41)
Albumin: 3.6 g/dL (ref 3.5–5.0)
Anion gap: 8 (ref 5–15)
BUN: 8 mg/dL (ref 6–20)
CALCIUM: 8.5 mg/dL — AB (ref 8.9–10.3)
CHLORIDE: 108 mmol/L (ref 101–111)
CO2: 20 mmol/L — AB (ref 22–32)
CREATININE: 0.58 mg/dL (ref 0.44–1.00)
Glucose, Bld: 98 mg/dL (ref 65–99)
Potassium: 3.5 mmol/L (ref 3.5–5.1)
Sodium: 136 mmol/L (ref 135–145)
Total Bilirubin: 0.3 mg/dL (ref 0.3–1.2)
Total Protein: 6.9 g/dL (ref 6.5–8.1)

## 2017-06-22 LAB — CBC WITH DIFFERENTIAL/PLATELET
Basophils Absolute: 0 10*3/uL (ref 0.0–0.1)
Basophils Relative: 0 %
EOS PCT: 4 %
Eosinophils Absolute: 0.3 10*3/uL (ref 0.0–0.7)
HEMATOCRIT: 26.5 % — AB (ref 36.0–46.0)
Hemoglobin: 7.8 g/dL — ABNORMAL LOW (ref 12.0–15.0)
LYMPHS ABS: 1.9 10*3/uL (ref 0.7–4.0)
Lymphocytes Relative: 29 %
MCH: 21 pg — ABNORMAL LOW (ref 26.0–34.0)
MCHC: 29.4 g/dL — ABNORMAL LOW (ref 30.0–36.0)
MCV: 71.2 fL — AB (ref 78.0–100.0)
MONO ABS: 0.4 10*3/uL (ref 0.1–1.0)
MONOS PCT: 6 %
NEUTROS ABS: 3.9 10*3/uL (ref 1.7–7.7)
Neutrophils Relative %: 61 %
PLATELETS: 322 10*3/uL (ref 150–400)
RBC: 3.72 MIL/uL — ABNORMAL LOW (ref 3.87–5.11)
RDW: 16 % — ABNORMAL HIGH (ref 11.5–15.5)
WBC: 6.5 10*3/uL (ref 4.0–10.5)

## 2017-06-22 LAB — LIPASE, BLOOD: LIPASE: 31 U/L (ref 11–51)

## 2017-06-22 MED ORDER — HYDRALAZINE HCL 20 MG/ML IJ SOLN: 10.0000 mg | INTRAMUSCULAR | Status: DC | PRN

## 2017-06-22 MED ORDER — ENOXAPARIN SODIUM 40 MG/0.4ML ~~LOC~~ SOLN
40.0000 mg | SUBCUTANEOUS | Status: DC
  Administered 2017-06-22 – 2017-06-23 (×2): 40 mg via SUBCUTANEOUS
  Filled 2017-06-22 (×2): qty 0.4

## 2017-06-22 MED ORDER — ACETAMINOPHEN 650 MG RE SUPP: 650.0000 mg | Freq: Four times a day (QID) | RECTAL | Status: DC | PRN

## 2017-06-22 MED ORDER — HYDROCODONE-ACETAMINOPHEN 5-325 MG PO TABS
1.0000 | ORAL_TABLET | ORAL | Status: DC | PRN
  Administered 2017-06-24: 2 via ORAL
  Filled 2017-06-22: qty 2

## 2017-06-22 MED ORDER — DIPHENHYDRAMINE HCL 50 MG/ML IJ SOLN: 25.0000 mg | Freq: Four times a day (QID) | INTRAMUSCULAR | Status: DC | PRN

## 2017-06-22 MED ORDER — ONDANSETRON 4 MG PO TBDP: 4.0000 mg | ORAL_TABLET | Freq: Four times a day (QID) | ORAL | Status: DC | PRN

## 2017-06-22 MED ORDER — MORPHINE SULFATE (PF) 4 MG/ML IV SOLN
2.0000 mg | INTRAVENOUS | Status: DC | PRN
  Administered 2017-06-22 – 2017-06-24 (×6): 2 mg via INTRAVENOUS
  Filled 2017-06-22 (×6): qty 1

## 2017-06-22 MED ORDER — ACETAMINOPHEN 325 MG PO TABS
650.0000 mg | ORAL_TABLET | Freq: Four times a day (QID) | ORAL | Status: DC | PRN
  Administered 2017-06-23: 650 mg via ORAL
  Filled 2017-06-22: qty 2

## 2017-06-22 MED ORDER — HYDROMORPHONE HCL 1 MG/ML IJ SOLN
1.0000 mg | Freq: Once | INTRAMUSCULAR | Status: AC
  Administered 2017-06-22: 1 mg via INTRAVENOUS
  Filled 2017-06-22: qty 1

## 2017-06-22 MED ORDER — DIPHENHYDRAMINE HCL 25 MG PO CAPS: 25.0000 mg | ORAL_CAPSULE | Freq: Four times a day (QID) | ORAL | Status: DC | PRN

## 2017-06-22 MED ORDER — SODIUM CHLORIDE 0.9 % IV SOLN
INTRAVENOUS | Status: DC
  Administered 2017-06-22: 22:00:00 via INTRAVENOUS

## 2017-06-22 MED ORDER — ONDANSETRON HCL 4 MG/2ML IJ SOLN
4.0000 mg | Freq: Four times a day (QID) | INTRAMUSCULAR | Status: DC | PRN
  Administered 2017-06-22 – 2017-06-24 (×2): 4 mg via INTRAVENOUS
  Filled 2017-06-22 (×2): qty 2

## 2017-06-22 MED ORDER — IBUPROFEN 600 MG PO TABS
600.0000 mg | ORAL_TABLET | Freq: Four times a day (QID) | ORAL | Status: DC | PRN
Start: 2017-06-22 — End: 2017-06-24

## 2017-06-22 MED ORDER — DEXTROSE 5 % IV SOLN
2.0000 g | INTRAVENOUS | Status: DC
  Administered 2017-06-22 – 2017-06-23 (×2): 2 g via INTRAVENOUS
  Filled 2017-06-22 (×3): qty 2

## 2017-06-22 MED ORDER — METOPROLOL TARTRATE 5 MG/5ML IV SOLN: 5.0000 mg | Freq: Four times a day (QID) | INTRAVENOUS | Status: DC | PRN

## 2017-06-22 NOTE — ED Notes (Signed)
Nurse starting IV and drawing labs. 

## 2017-06-22 NOTE — ED Provider Notes (Signed)
[facility] DEPT Provider Note   CSN: [id number] Arrival date & time: 06/22/17  1725     History   Chief Complaint Chief Complaint  Patient presents with  . Abdominal Pain    HPI Valerie [name] is a 31 y.o. female with past medical history of asthma, presenting for subsequent visit with acute onset of epigastric abdominal pain that began yesterday. Patient was seen in ED last night with right upper quadrant ultrasound showing cholelithiasis without cholecystitis. Patient's pain and nausea was managed in ED, and patient was discharged with 15 mg PO morphine and when necessary Zofran for symptoms. She was also given surgical referral. Patient reports back today for worsening pain that is not relieved by PO morphine. She states pain has worsened, as sharp and constant, and moved to her right upper quadrant with radiation to her right shoulder. She reports associated nausea with 5 episodes of emesis last night, however relieved by prescribe Zofran. She denies fever/chills, or any other symptoms today.  The history is provided by the patient. The history is limited by a language barrier. A language interpreter was used (Pt's family member translated by request of the patient).    Past Medical History:  Diagnosis Date  . Asthma   . PONV (postoperative nausea and vomiting)     Patient Active Problem List   Diagnosis Date Noted  . Status post repeat low transverse cesarean section 08/31/2016  . History of unexplained stillbirth 08/22/2016  . Sterilization consult 05/27/2016  . Asthma affecting pregnancy, antepartum 04/29/2016  . Supervision of high risk pregnancy, antepartum 04/29/2016  . Group B Streptococcus carrier, +RV culture, currently pregnant 04/29/2016  . UTI in pregnancy 04/29/2016  . History of 2 cesarean sections 04/29/2016    Past Surgical History:  Procedure Laterality Date  . CESAREAN SECTION    . CESAREAN SECTION WITH BILATERAL TUBAL LIGATION Bilateral  08/29/2016   Procedure: CESAREAN SECTION WITH BILATERAL TUBAL LIGATION;  Surgeon: Adam Phenix, MD;  Location: [facility] BIRTHING SUITES;  Service: Obstetrics;  Laterality: Bilateral;  . OVARIAN CYST REMOVAL      OB History    Gravida Para Term Preterm AB Living   5 4 3 1 1 3    SAB TAB Ectopic Multiple Live Births   1 0 0 0 3       Home Medications    Prior to Admission medications   Medication Sig Start Date End Date Taking? Authorizing Provider  cephALEXin (KEFLEX) 500 MG capsule Take 1 capsule (500 mg total) by mouth 4 (four) times daily. 09/26/16   Armando Reichert, CNM  ibuprofen (ADVIL,MOTRIN) 800 MG tablet Take 1 tablet (800 mg total) by mouth every 8 (eight) hours as needed. 09/26/16   Armando Reichert, CNM  morphine (MSIR) 15 MG tablet Take 1 tablet (15 mg total) by mouth every 4 (four) hours as needed for severe pain. 06/21/17   Melene Plan, DO  ondansetron (ZOFRAN ODT) 4 MG disintegrating tablet Take 1 tablet (4 mg total) by mouth every 8 (eight) hours as needed for nausea or vomiting. 06/21/17   Melene Plan, DO  Prenatal Multivit-Min-Fe-FA (PRENATAL VITAMINS) 0.8 MG tablet Take 1 tablet by mouth daily. 04/29/16   Wouk, Wilfred Curtis, MD    Family History No family history on file.  Social History Social History  Substance Use Topics  . Smoking status: Never Smoker  . Smokeless tobacco: Never Used  . Alcohol use No     Allergies   Patient has no  known allergies.   Review of Systems Review of Systems  Constitutional: Negative for chills and fever.  Gastrointestinal: Positive for abdominal pain, nausea and vomiting. Negative for constipation and diarrhea.  Genitourinary: Negative for dysuria and frequency.  All other systems reviewed and are negative.    Physical Exam Updated Vital Signs BP (!) 131/45   Pulse (!) 53   Temp 98.6 F (37 C) (Oral)   Resp 18   Ht 5\' 6"  (1.676 m)   Wt 95.7 kg (211 lb)   LMP 06/05/2017   SpO2 100%   BMI 34.06 kg/m   Physical Exam    Constitutional: She appears well-developed and well-nourished.  Patient appears uncomfortable  HENT:  Head: Normocephalic and atraumatic.  Mouth/Throat: Oropharynx is clear and moist.  Eyes: Conjunctivae are normal.  Cardiovascular: Regular rhythm, normal heart sounds and intact distal pulses.   Slightly bradycardic  Pulmonary/Chest: Effort normal and breath sounds normal. No respiratory distress. She has no wheezes. She has no rales.  Abdominal: Soft. Bowel sounds are normal. She exhibits no distension and no mass. There is tenderness in the right upper quadrant and epigastric area. There is positive Murphy's sign. There is no rebound and no guarding. No hernia.  Neurological: She is alert.  Skin: Skin is warm.  Psychiatric: She has a normal mood and affect. Her behavior is normal.  Nursing note and vitals reviewed.    ED Treatments / Results  Labs (all labs ordered are listed, but only abnormal results are displayed) Labs Reviewed  CBC WITH DIFFERENTIAL/PLATELET - Abnormal; Notable for the following:       Result Value   RBC 3.72 (*)    Hemoglobin 7.8 (*)    HCT 26.5 (*)    MCV 71.2 (*)    MCH 21.0 (*)    MCHC 29.4 (*)    RDW 16.0 (*)    All other components within normal limits  COMPREHENSIVE METABOLIC PANEL  LIPASE, BLOOD    EKG  EKG Interpretation None       Radiology [place] Abdomen Limited Ruq  Result Date: 06/21/2017 CLINICAL DATA:  Right upper quadrant pain tonight EXAM: ULTRASOUND ABDOMEN LIMITED RIGHT UPPER QUADRANT COMPARISON:  None. FINDINGS: Gallbladder: Multiple stones measuring up to 4 mm. No gallbladder mural thickening or pericholecystic fluid. No tenderness to probe pressure over the gallbladder. Common bile duct: Diameter: 2.1 mm Liver: No focal lesion identified. Within normal limits in parenchymal echogenicity. Portal vein is patent on color Doppler imaging with normal direction of blood flow towards the liver. IMPRESSION: Cholelithiasis.  No ultrasound  evidence of acute cholecystitis. Electronically Signed   By: Ellery Plunkaniel R Mitchell M.D.   On: 06/21/2017 22:32    Procedures Procedures (including critical care time)  Medications Ordered in ED Medications  HYDROmorphone (DILAUDID) injection 1 mg (1 mg Intravenous Given 06/22/17 1937)     Initial Impression / Assessment and Plan / ED Course  I have reviewed the triage vital signs and the nursing notes.  Pertinent labs & imaging results that were available during my care of the patient were reviewed by me and considered in my medical decision making (see chart for details).  Clinical Course as of Jun 22 1952  Wynelle LinkSun Jun 22, 2017  1941 Spoke with Dr. Sheliah HatchKinsinger, who is accepting patient.  [JR]    Clinical Course User Index [JR] Russo, [place] N, PA-C    Patient presenting for subsequent visit with symptomatic cholelithiasis, confirmed on ultrasound done last night at 2230 in ED. Patient  return to the ED with worsening symptoms, not controlled with prescribed PO or pain. Pt is afebrile. Pain managed in ED with Dilaudid. Pt is hemodynamically stable. CBC, CMP, lipase without significant change from yesterday. Consulted general surgery, spoke with Dr. Sheliah Hatch, who is accepting admission.   Patient discussed with Dr. Criss Alvine.  The patient appears reasonably stabilized for admission considering the current resources, flow, and capabilities available in the ED at this time, and I doubt any other [facility] requiring further screening and/or treatment in the ED prior to admission.  Final Clinical Impressions(s) / ED Diagnoses   Final diagnoses:  Symptomatic cholelithiasis    New Prescriptions New Prescriptions   No medications on file     Russo, [place] N, PA-C 06/22/17 2025    Pricilla Loveless, MD 07/03/17 [phone]

## 2017-06-22 NOTE — H&P (Signed)
Reason for Consult: abdominal p ain Referring Physician: [name], [name] is an 31 y.o. female.  HPI: 31 yo female with 2 days of epigastric abdominal pain. She has had one bout years ago. The pain was not related to eating. The pain is in the right side under the ribs and radiates to the back. She has no appetite. She is nauseated but has not vomited. She came to the ER yesterday and was diagnosed with gallstones and sent home with pain medications but that did not help much with the pain and she has returned.  Past Medical History:  Diagnosis Date  . Asthma   . PONV (postoperative nausea and vomiting)     Past Surgical History:  Procedure Laterality Date  . CESAREAN SECTION    . CESAREAN SECTION WITH BILATERAL TUBAL LIGATION Bilateral 08/29/2016   Procedure: CESAREAN SECTION WITH BILATERAL TUBAL LIGATION;  Surgeon: Valerie Mode, MD;  Location: [facility];  Service: Obstetrics;  Laterality: Bilateral;  . OVARIAN CYST REMOVAL      No family history on file.  Social History:  reports that she has never smoked. She has never used smokeless tobacco. She reports that she does not drink alcohol or use drugs.  Allergies: No Known Allergies  Medications: I have reviewed the patient's current medications.  Results for orders placed or performed during the hospital encounter of 06/22/17 (from the past 48 hour(s))  Comprehensive metabolic panel     Status: Abnormal   Collection Time: 06/22/17  7:00 PM  Result Value Ref Range   Sodium 136 135 - 145 mmol/L   Potassium 3.5 3.5 - 5.1 mmol/L   Chloride 108 101 - 111 mmol/L   CO2 20 (L) 22 - 32 mmol/L   Glucose, Bld 98 65 - 99 mg/dL   BUN 8 6 - 20 mg/dL   Creatinine, Ser 0.58 0.44 - 1.00 mg/dL   Calcium 8.5 (L) 8.9 - 10.3 mg/dL   Total Protein 6.9 6.5 - 8.1 g/dL   Albumin 3.6 3.5 - 5.0 g/dL   AST 18 15 - 41 U/L   ALT 15 14 - 54 U/L   Alkaline Phosphatase 61 38 - 126 U/L   Total Bilirubin 0.3 0.3 - 1.2 mg/dL     GFR calc non Af Amer >60 >60 mL/min   GFR calc Af Amer >60 >60 mL/min    Comment: (NOTE) The eGFR has been calculated using the CKD EPI equation. This calculation has not been validated in all clinical situations. eGFR's persistently <60 mL/min signify possible Chronic Kidney Disease.    Anion gap 8 5 - 15  Lipase, blood     Status: None   Collection Time: 06/22/17  7:00 PM  Result Value Ref Range   Lipase 31 11 - 51 U/L  CBC with Differential     Status: Abnormal   Collection Time: 06/22/17  7:00 PM  Result Value Ref Range   WBC 6.5 4.0 - 10.5 K/uL    Comment: REPEATED TO VERIFY WHITE COUNT CONFIRMED ON SMEAR    RBC 3.72 (L) 3.87 - 5.11 MIL/uL   Hemoglobin 7.8 (L) 12.0 - 15.0 g/dL   HCT 26.5 (L) 36.0 - 46.0 %   MCV 71.2 (L) 78.0 - 100.0 fL   MCH 21.0 (L) 26.0 - 34.0 pg   MCHC 29.4 (L) 30.0 - 36.0 g/dL   RDW 16.0 (H) 11.5 - 15.5 %   Platelets 322 150 - 400 K/uL  Comment: REPEATED TO VERIFY PLATELET COUNT CONFIRMED BY SMEAR    Neutrophils Relative % 61 %   Lymphocytes Relative 29 %   Monocytes Relative 6 %   Eosinophils Relative 4 %   Basophils Relative 0 %   Neutro Abs 3.9 1.7 - 7.7 K/uL   Lymphs Abs 1.9 0.7 - 4.0 K/uL   Monocytes Absolute 0.4 0.1 - 1.0 K/uL   Eosinophils Absolute 0.3 0.0 - 0.7 K/uL   Basophils Absolute 0.0 0.0 - 0.1 K/uL   RBC Morphology POLYCHROMASIA PRESENT     Comment: TEARDROP CELLS    US Abdomen Limited Ruq  Result Date: 06/21/2017 CLINICAL DATA:  Right upper quadrant pain tonight EXAM: ULTRASOUND ABDOMEN LIMITED RIGHT UPPER QUADRANT COMPARISON:  None. FINDINGS: Gallbladder: Multiple stones measuring up to 4 mm. No gallbladder mural thickening or pericholecystic fluid. No tenderness to probe pressure over the gallbladder. Common bile duct: Diameter: 2.1 mm Liver: No focal lesion identified. Within normal limits in parenchymal echogenicity. Portal vein is patent on color Doppler imaging with normal direction of blood flow towards the liver.  IMPRESSION: Cholelithiasis.  No ultrasound evidence of acute cholecystitis. Electronically Signed   By: Valerie [name] M.D.   On: 06/21/2017 22:32    Review of Systems  Constitutional: Negative for chills and fever.  HENT: Negative for hearing loss.   Eyes: Negative for blurred vision and double vision.  Respiratory: Negative for cough and hemoptysis.   Cardiovascular: Negative for chest pain and palpitations.  Gastrointestinal: Positive for abdominal pain and nausea. Negative for constipation, diarrhea and vomiting.  Genitourinary: Negative for dysuria and urgency.  Musculoskeletal: Negative for myalgias and neck pain.  Skin: Negative for itching and rash.  Neurological: Negative for dizziness, tingling and headaches.  Endo/Heme/Allergies: Does not bruise/bleed easily.  Psychiatric/Behavioral: Negative for depression and suicidal ideas.   Blood pressure (!) 131/45, pulse (!) 53, temperature 98.6 F (37 C), temperature source Oral, resp. rate 18, height _0  (1.676 m), weight 95.7 kg (211 lb), last menstrual period 06/05/2017, SpO2 100 %, unknown if currently breastfeeding. Physical Exam  Vitals reviewed. Constitutional: She is oriented to person, place, and time. She appears well-developed and well-nourished.  HENT:  Head: Normocephalic and atraumatic.  Eyes: Pupils are equal, round, and reactive to light. Conjunctivae and EOM are normal.  Neck: Normal range of motion. Neck supple.  Cardiovascular: Normal rate and regular rhythm.   Respiratory: Effort normal and breath sounds normal.  GI: Soft. Bowel sounds are normal. She exhibits no distension. There is tenderness in the right upper quadrant and epigastric area.  Musculoskeletal: Normal range of motion.  Neurological: She is alert and oriented to person, place, and time.  Skin: Skin is warm and dry.  Psychiatric: She has a normal mood and affect. Her behavior is normal.      Assessment/Plan: 31 yo female with chronic  calculous cholecystitis with failure of outpatient management -admit to hospital -OR in am -NPO -IVF  Valerie [name] 06/22/2017, 8:08 PM

## 2017-06-22 NOTE — ED Triage Notes (Signed)
Pt reports mid upper abd pain and RUQ pain since yesterday.  States she was seen in ED yesterday and told pain was from gallbladder.  Taking Zofran and Morphine.  Pain worse today.

## 2017-06-23 ENCOUNTER — Encounter (HOSPITAL_COMMUNITY): Payer: Self-pay | Admitting: Certified Registered Nurse Anesthetist

## 2017-06-23 ENCOUNTER — Encounter (HOSPITAL_COMMUNITY): Admission: EM | Disposition: A | Discharge status: Home / Self Care | Payer: Self-pay | Source: Home / Self Care

## 2017-06-23 ENCOUNTER — Inpatient Hospital Stay (HOSPITAL_COMMUNITY): Payer: BLUE CROSS/BLUE SHIELD | Admitting: Anesthesiology

## 2017-06-23 HISTORY — PX: CHOLECYSTECTOMY: SHX55

## 2017-06-23 LAB — COMPREHENSIVE METABOLIC PANEL
ALBUMIN: 3.2 g/dL — AB (ref 3.5–5.0)
ALK PHOS: 60 U/L (ref 38–126)
ALT: 13 U/L — AB (ref 14–54)
AST: 12 U/L — AB (ref 15–41)
Anion gap: 7 (ref 5–15)
BUN: 7 mg/dL (ref 6–20)
CHLORIDE: 110 mmol/L (ref 101–111)
CO2: 23 mmol/L (ref 22–32)
CREATININE: 0.52 mg/dL (ref 0.44–1.00)
Calcium: 8.1 mg/dL — ABNORMAL LOW (ref 8.9–10.3)
GFR calc non Af Amer: >60 mL/min (ref >60)
GLUCOSE: 95 mg/dL (ref 65–99)
Potassium: 3.6 mmol/L (ref 3.5–5.1)
SODIUM: 140 mmol/L (ref 135–145)
Total Bilirubin: 0.2 mg/dL — ABNORMAL LOW (ref 0.3–1.2)
Total Protein: 6.4 g/dL — ABNORMAL LOW (ref 6.5–8.1)

## 2017-06-23 LAB — CBC
HCT: 24.7 % — ABNORMAL LOW (ref 36.0–46.0)
Hemoglobin: 7.1 g/dL — ABNORMAL LOW (ref 12.0–15.0)
MCH: 20.5 pg — AB (ref 26.0–34.0)
MCHC: 28.7 g/dL — AB (ref 30.0–36.0)
MCV: 71.4 fL — AB (ref 78.0–100.0)
PLATELETS: 303 10*3/uL (ref 150–400)
RBC: 3.46 MIL/uL — AB (ref 3.87–5.11)
RDW: 16.1 % — ABNORMAL HIGH (ref 11.5–15.5)
WBC: 7.1 10*3/uL (ref 4.0–10.5)

## 2017-06-23 LAB — ABO/RH: ABO/RH(D): O POS

## 2017-06-23 LAB — POCT I-STAT 4, (NA,K, GLUC, HGB,HCT)
Glucose, Bld: 81 mg/dL (ref 65–99)
HEMATOCRIT: 27 % — AB (ref 36.0–46.0)
HEMOGLOBIN: 9.2 g/dL — AB (ref 12.0–15.0)
Potassium: 3.6 mmol/L (ref 3.5–5.1)
SODIUM: 141 mmol/L (ref 135–145)

## 2017-06-23 LAB — SURGICAL PCR SCREEN
MRSA, PCR: NEGATIVE
Staphylococcus aureus: NEGATIVE

## 2017-06-23 LAB — PREPARE RBC (CROSSMATCH)

## 2017-06-23 SURGERY — LAPAROSCOPIC CHOLECYSTECTOMY WITH INTRAOPERATIVE CHOLANGIOGRAM
Anesthesia: General | Site: Abdomen

## 2017-06-23 MED ORDER — ONDANSETRON HCL 4 MG/2ML IJ SOLN
INTRAMUSCULAR | Status: AC
  Filled 2017-06-23: qty 2

## 2017-06-23 MED ORDER — LIDOCAINE 2% (20 MG/ML) 5 ML SYRINGE
INTRAMUSCULAR | Status: AC
  Filled 2017-06-23: qty 5

## 2017-06-23 MED ORDER — 0.9 % SODIUM CHLORIDE (POUR BTL) OPTIME
TOPICAL | Status: DC | PRN
  Administered 2017-06-23: 1000 mL

## 2017-06-23 MED ORDER — BUPIVACAINE-EPINEPHRINE 0.25% -1:200000 IJ SOLN
INTRAMUSCULAR | Status: DC | PRN
  Administered 2017-06-23: 24 mL

## 2017-06-23 MED ORDER — PROPOFOL 10 MG/ML IV BOLUS
INTRAVENOUS | Status: DC | PRN
  Administered 2017-06-23: 200 mg via INTRAVENOUS

## 2017-06-23 MED ORDER — SUGAMMADEX SODIUM 200 MG/2ML IV SOLN
INTRAVENOUS | Status: DC | PRN
  Administered 2017-06-23: 300 mg via INTRAVENOUS

## 2017-06-23 MED ORDER — FENTANYL CITRATE (PF) 100 MCG/2ML IJ SOLN
INTRAMUSCULAR | Status: AC
  Administered 2017-06-23: 50 ug via INTRAVENOUS
  Filled 2017-06-23: qty 4

## 2017-06-23 MED ORDER — PROMETHAZINE HCL 25 MG/ML IJ SOLN
6.2500 mg | INTRAMUSCULAR | Status: DC | PRN
  Administered 2017-06-23: 12.5 mg via INTRAVENOUS

## 2017-06-23 MED ORDER — PROPOFOL 10 MG/ML IV BOLUS
INTRAVENOUS | Status: AC
  Filled 2017-06-23: qty 20

## 2017-06-23 MED ORDER — ROCURONIUM BROMIDE 100 MG/10ML IV SOLN
INTRAVENOUS | Status: DC | PRN
  Administered 2017-06-23: 50 mg via INTRAVENOUS

## 2017-06-23 MED ORDER — DEXAMETHASONE SODIUM PHOSPHATE 10 MG/ML IJ SOLN
INTRAMUSCULAR | Status: AC
  Filled 2017-06-23: qty 1

## 2017-06-23 MED ORDER — IOPAMIDOL (ISOVUE-300) INJECTION 61%
INTRAVENOUS | Status: AC
  Filled 2017-06-23: qty 50

## 2017-06-23 MED ORDER — FENTANYL CITRATE (PF) 100 MCG/2ML IJ SOLN
INTRAMUSCULAR | Status: DC | PRN
  Administered 2017-06-23 (×5): 50 ug via INTRAVENOUS

## 2017-06-23 MED ORDER — PROMETHAZINE HCL 25 MG/ML IJ SOLN
INTRAMUSCULAR | Status: AC
  Administered 2017-06-23: 12.5 mg via INTRAVENOUS
  Filled 2017-06-23: qty 1

## 2017-06-23 MED ORDER — SODIUM CHLORIDE 0.9 % IV SOLN
Freq: Once | INTRAVENOUS | Status: AC
  Administered 2017-06-23: 10:00:00 via INTRAVENOUS

## 2017-06-23 MED ORDER — OXYCODONE HCL 5 MG PO TABS
5.0000 mg | ORAL_TABLET | ORAL | Status: DC | PRN
  Administered 2017-06-23 – 2017-06-24 (×2): 10 mg via ORAL
  Filled 2017-06-23 (×2): qty 2

## 2017-06-23 MED ORDER — ONDANSETRON HCL 4 MG/2ML IJ SOLN
INTRAMUSCULAR | Status: DC | PRN
  Administered 2017-06-23: 4 mg via INTRAVENOUS

## 2017-06-23 MED ORDER — FENTANYL CITRATE (PF) 250 MCG/5ML IJ SOLN
INTRAMUSCULAR | Status: AC
  Filled 2017-06-23: qty 5

## 2017-06-23 MED ORDER — SODIUM CHLORIDE 0.9 % IV SOLN: INTRAVENOUS | Status: DC | PRN

## 2017-06-23 MED ORDER — MIDAZOLAM HCL 5 MG/5ML IJ SOLN
INTRAMUSCULAR | Status: DC | PRN
  Administered 2017-06-23: 2 mg via INTRAVENOUS

## 2017-06-23 MED ORDER — DEXAMETHASONE SODIUM PHOSPHATE 4 MG/ML IJ SOLN
INTRAMUSCULAR | Status: DC | PRN
  Administered 2017-06-23: 10 mg via INTRAVENOUS

## 2017-06-23 MED ORDER — LACTATED RINGERS IV SOLN
INTRAVENOUS | Status: DC
  Administered 2017-06-23 (×2): via INTRAVENOUS

## 2017-06-23 MED ORDER — ROCURONIUM BROMIDE 10 MG/ML (PF) SYRINGE
PREFILLED_SYRINGE | INTRAVENOUS | Status: AC
  Filled 2017-06-23: qty 5

## 2017-06-23 MED ORDER — BUPIVACAINE-EPINEPHRINE (PF) 0.25% -1:200000 IJ SOLN
INTRAMUSCULAR | Status: AC
  Filled 2017-06-23: qty 30

## 2017-06-23 MED ORDER — LIDOCAINE HCL (CARDIAC) 20 MG/ML IV SOLN
INTRAVENOUS | Status: DC | PRN
  Administered 2017-06-23: 60 mg via INTRAVENOUS

## 2017-06-23 MED ORDER — SUGAMMADEX SODIUM 500 MG/5ML IV SOLN
INTRAVENOUS | Status: AC
  Filled 2017-06-23: qty 5

## 2017-06-23 MED ORDER — FENTANYL CITRATE (PF) 100 MCG/2ML IJ SOLN
25.0000 ug | INTRAMUSCULAR | Status: DC | PRN
  Administered 2017-06-23 (×2): 50 ug via INTRAVENOUS

## 2017-06-23 MED ORDER — MIDAZOLAM HCL 2 MG/2ML IJ SOLN
INTRAMUSCULAR | Status: AC
  Filled 2017-06-23: qty 2

## 2017-06-23 SURGICAL SUPPLY — 42 items
APPLIER CLIP 5 13 M/L LIGAMAX5 (MISCELLANEOUS) ×2
BLADE CLIPPER SURG (BLADE) IMPLANT
CANISTER SUCT 3000ML PPV (MISCELLANEOUS) ×2 IMPLANT
CHLORAPREP W/TINT 26ML (MISCELLANEOUS) ×2 IMPLANT
CLIP APPLIE 5 13 M/L LIGAMAX5 (MISCELLANEOUS) ×1 IMPLANT
COVER MAYO STAND STRL (DRAPES) ×2 IMPLANT
COVER SURGICAL LIGHT HANDLE (MISCELLANEOUS) ×2 IMPLANT
DERMABOND ADVANCED (GAUZE/BANDAGES/DRESSINGS) ×1
DERMABOND ADVANCED .7 DNX12 (GAUZE/BANDAGES/DRESSINGS) ×1 IMPLANT
DRAPE C-ARM 42X72 X-RAY (DRAPES) ×2 IMPLANT
ELECT REM PT RETURN 9FT ADLT (ELECTROSURGICAL) ×2
ELECTRODE REM PT RTRN 9FT ADLT (ELECTROSURGICAL) ×1 IMPLANT
FILTER SMOKE EVAC LAPAROSHD (FILTER) IMPLANT
GLOVE BIO SURGEON STRL SZ8 (GLOVE) ×2 IMPLANT
GLOVE BIOGEL PI IND STRL 8 (GLOVE) ×1 IMPLANT
GLOVE BIOGEL PI INDICATOR 8 (GLOVE) ×1
GOWN STRL REUS W/ TWL LRG LVL3 (GOWN DISPOSABLE) ×2 IMPLANT
GOWN STRL REUS W/ TWL XL LVL3 (GOWN DISPOSABLE) ×1 IMPLANT
GOWN STRL REUS W/TWL LRG LVL3 (GOWN DISPOSABLE) ×2
GOWN STRL REUS W/TWL XL LVL3 (GOWN DISPOSABLE) ×1
KIT BASIN OR (CUSTOM PROCEDURE TRAY) ×2 IMPLANT
KIT ROOM TURNOVER OR (KITS) ×2 IMPLANT
L-HOOK LAP DISP 36CM (ELECTROSURGICAL) ×2
LHOOK LAP DISP 36CM (ELECTROSURGICAL) ×1 IMPLANT
NEEDLE 22X1 1/2 (OR ONLY) (NEEDLE) ×2 IMPLANT
NS IRRIG 1000ML POUR BTL (IV SOLUTION) ×2 IMPLANT
PAD ARMBOARD 7.5X6 YLW CONV (MISCELLANEOUS) ×2 IMPLANT
PENCIL BUTTON HOLSTER BLD 10FT (ELECTRODE) ×2 IMPLANT
POUCH RETRIEVAL ECOSAC 10 (ENDOMECHANICALS) ×1 IMPLANT
POUCH RETRIEVAL ECOSAC 10MM (ENDOMECHANICALS) ×1
SCISSORS LAP 5X35 DISP (ENDOMECHANICALS) ×2 IMPLANT
SET CHOLANGIOGRAPH 5 50 .035 (SET/KITS/TRAYS/PACK) ×2 IMPLANT
SET IRRIG TUBING LAPAROSCOPIC (IRRIGATION / IRRIGATOR) ×2 IMPLANT
SLEEVE ENDOPATH XCEL 5M (ENDOMECHANICALS) ×4 IMPLANT
SPECIMEN JAR SMALL (MISCELLANEOUS) ×2 IMPLANT
SUT VIC AB 4-0 PS2 27 (SUTURE) ×2 IMPLANT
TOWEL OR 17X24 6PK STRL BLUE (TOWEL DISPOSABLE) ×2 IMPLANT
TOWEL OR 17X26 10 PK STRL BLUE (TOWEL DISPOSABLE) ×2 IMPLANT
TRAY LAPAROSCOPIC MC (CUSTOM PROCEDURE TRAY) ×2 IMPLANT
TROCAR XCEL BLUNT TIP 100MML (ENDOMECHANICALS) ×2 IMPLANT
TROCAR XCEL NON-BLD 5MMX100MML (ENDOMECHANICALS) ×2 IMPLANT
TUBING INSUFFLATION (TUBING) ×2 IMPLANT

## 2017-06-23 NOTE — Anesthesia Procedure Notes (Signed)
Procedure Name: Intubation Date/Time: 06/23/2017 3:35 PM Performed by: Reine JustFLOWERS, Ellinore Merced T Pre-anesthesia Checklist: Patient identified, Emergency Drugs available, Suction available and Patient being monitored Patient Re-evaluated:Patient Re-evaluated prior to induction Oxygen Delivery Method: Circle system utilized Preoxygenation: Pre-oxygenation with 100% oxygen Induction Type: IV induction Ventilation: Mask ventilation without difficulty Laryngoscope Size: Miller and 2 Grade View: Grade I Tube type: Oral Tube size: 7.5 mm Number of attempts: 1 Airway Equipment and Method: Patient positioned with wedge pillow and Stylet Placement Confirmation: ETT inserted through vocal cords under direct vision,  positive ETCO2 and breath sounds checked- equal and bilateral Secured at: 22 cm Tube secured with: Tape Dental Injury: Teeth and Oropharynx as per pre-operative assessment

## 2017-06-23 NOTE — Progress Notes (Signed)
Day of Surgery   Subjective/Chief Complaint: Less epigastric and RUQ pain   Objective: Vital signs in last 24 hours: Temp:  [97.7 F (36.5 C)-98.6 F (37 C)] 98 F (36.7 C) (09/03 0531) Pulse Rate:  [53-77] 77 (09/03 0531) Resp:  [18-19] 19 (09/03 0531) BP: (112-131)/(45-67) 114/45 (09/03 0531) SpO2:  [100 %] 100 % (09/03 0531) Weight:  [95.7 kg (211 lb)] 95.7 kg (211 lb) (09/02 1731) Last BM Date: 06/22/17  Intake/Output from previous day: 09/02 0701 - 09/03 0700 In: 682.5 [I.V.:632.5; IV Piggyback:50] Out: -  Intake/Output this shift: No intake/output data recorded.  General appearance: cooperative Resp: clear to auscultation bilaterally Cardio: regular rate and rhythm GI: mild RUQ tenderness Extremities: calves soft  Lab Results:   Recent Labs  06/22/17 1900 06/23/17 0419  WBC 6.5 7.1  HGB 7.8* 7.1*  HCT 26.5* 24.7*  PLT 322 303   BMET  Recent Labs  06/22/17 1900 06/23/17 0419  NA 136 140  K 3.5 3.6  CL 108 110  CO2 20* 23  GLUCOSE 98 95  BUN 8 7  CREATININE 0.58 0.52  CALCIUM 8.5* 8.1*   PT/INR No results for input(s): LABPROT, INR in the last 72 hours. ABG No results for input(s): PHART, HCO3 in the last 72 hours.  Invalid input(s): PCO2, PO2  Studies/Results: [place] Abdomen Limited Ruq  Result Date: 06/21/2017 CLINICAL DATA:  Right upper quadrant pain tonight EXAM: ULTRASOUND ABDOMEN LIMITED RIGHT UPPER QUADRANT COMPARISON:  None. FINDINGS: Gallbladder: Multiple stones measuring up to 4 mm. No gallbladder mural thickening or pericholecystic fluid. No tenderness to probe pressure over the gallbladder. Common bile duct: Diameter: 2.1 mm Liver: No focal lesion identified. Within normal limits in parenchymal echogenicity. Portal vein is patent on color Doppler imaging with normal direction of blood flow towards the liver. IMPRESSION: Cholelithiasis.  No ultrasound evidence of acute cholecystitis. Electronically Signed   By: Ellery Plunkaniel R Mitchell M.D.   On:  06/21/2017 22:32    Anti-infectives: Anti-infectives    Start     Dose/Rate Route Frequency Ordered Stop   06/22/17 2100  cefTRIAXone (ROCEPHIN) 2 g in dextrose 5 % 50 mL IVPB     2 g 100 mL/hr over 30 Minutes Intravenous Every 24 hours 06/22/17 2014        Assessment/Plan: Cholecystitis wth cholelithiasis - for lap chole/IOC today. Procedure, risks, and benefits D/W her and her husband. She agrees. Anemia - Hb 10 when she had a baby 11/17. Will transfuse 1u pre-op and prepare 2u to have in case needed. I discussed the risks/benefits of transfusion and she agrees.  LOS: 1 day    Valerie [name] E 06/23/2017

## 2017-06-23 NOTE — Transfer of Care (Signed)
Immediate Anesthesia Transfer of Care Note  Patient: Dannielle HuhSara Guzman Robles  Procedure(s) Performed: Procedure(s): LAPAROSCOPIC CHOLECYSTECTOMY (N/A)  Patient Location: PACU  Anesthesia Type:General  Level of Consciousness: awake, alert  and oriented  Airway & Oxygen Therapy: Patient Spontanous Breathing and Patient connected to nasal cannula oxygen  Post-op Assessment: Report given to RN and Post -op Vital signs reviewed and stable  Post vital signs: Reviewed and stable  Last Vitals:  Vitals:   06/23/17 1023 06/23/17 1310  BP: (!) 107/53 (!) 114/59  Pulse: (!) 55 (!) 53  Resp: 12 10  Temp: 36.8 C 36.6 C  SpO2: 100% 100%    Last Pain:  Vitals:   06/23/17 1310  TempSrc: Oral  PainSc:          Complications: No apparent anesthesia complications

## 2017-06-23 NOTE — Anesthesia Preprocedure Evaluation (Addendum)
Anesthesia Evaluation  Patient identified by MRN, date of birth, ID band Patient awake    Reviewed: Allergy & Precautions, H&P , NPO status , Patient's Chart, lab work & pertinent test results  History of Anesthesia Complications (+) PONV  Airway Mallampati: II  TM Distance: >3 FB Neck ROM: Full    Dental  (+) Dental Advisory Given   Pulmonary asthma ,    Pulmonary exam normal breath sounds clear to auscultation       Cardiovascular Exercise Tolerance: Good negative cardio ROS   Rhythm:regular Rate:Normal     Neuro/Psych negative neurological ROS  negative psych ROS   GI/Hepatic negative GI ROS, Neg liver ROS,   Endo/Other  Obesity  Renal/GU negative Renal ROS  negative genitourinary   Musculoskeletal negative musculoskeletal ROS (+)   Abdominal   Peds  Hematology negative hematology ROS (+)   Anesthesia Other Findings   Reproductive/Obstetrics                             Anesthesia Physical  Anesthesia Plan  ASA: II  Anesthesia Plan: General   Post-op Pain Management:    Induction:   PONV Risk Score and Plan: 4 or greater and Ondansetron, Dexamethasone, Midazolam, Scopolamine patch - Pre-op and Propofol infusion  Airway Management Planned: Oral ETT  Additional Equipment:   Intra-op Plan:   Post-operative Plan: Extubation in OR  Informed Consent: I have reviewed the patients History and Physical, chart, labs and discussed the procedure including the risks, benefits and alternatives for the proposed anesthesia with the patient or authorized representative who has indicated his/her understanding and acceptance.   Dental Advisory Given  Plan Discussed with: CRNA  Anesthesia Plan Comments:         Anesthesia Quick Evaluation

## 2017-06-23 NOTE — Op Note (Signed)
06/22/2017 - 06/23/2017  4:25 PM  PATIENT:  Valerie [name]  31 y.o. female  PRE-OPERATIVE DIAGNOSIS: cholecystitis with cholelithiasis  POST-OPERATIVE DIAGNOSIS:  cholecystitis with cholelithiasis  PROCEDURE:  Procedure(s): LAPAROSCOPIC CHOLECYSTECTOMY  SURGEON:  Surgeon(s): Violeta Gelinashompson, Brendt Dible, MD  ASSISTANTS: none   ANESTHESIA:   local and general  EBL:  Total I/O In: 436 [I.V.:100; Blood:336] Out: -   BLOOD ADMINISTERED:none  DRAINS: none   SPECIMEN:  Excision  DISPOSITION OF SPECIMEN:  PATHOLOGY  COUNTS:  YES  DICTATION: .Dragon Dictation Findings: Acute cholecystitis, narrow cystic duct  Procedure in detail: Valerie [name] presents for cholecystectomy. Valerie [name] was identified in the preop holding area. Informed consent was obtained. Valerie [name] received intravenous antibiotics. Valerie [name] was brought to the operating room and general endotracheal anesthesia was administered by the anesthesia staff. Her abdomen was prepped and draped in a sterile fashion. We did a time out procedure.The supraumbilical region was infiltrated with local. supraumbilical incision was made. Subcutaneous tissues were dissected down revealing the anterior fascia. This was divided sharply along the midline. Peritoneal cavity was entered under direct vision without complication. A 0 Vicryl pursestring was placed around the fascial opening. Hassan trocar was inserted into the abdomen. The abdomen was insufflated with carbon dioxide in standard fashion. Under direct vision a 5mm epigastric and 5 mm right port 2 were placed. Local was used at each port site. Laparoscopic exploration revealed an inflamed, edematous gallbladder. The dome was retracted superior medially. There were filmy adhesions to the infundibulum. These were swept down carefully. The infundibulum was retracted inferolaterally. Dissection began laterally and progressed medially. First, the anterior branch of the cystic artery was identified. This was clipped twice  proximally, once distally and divided. Further dissection revealed the cystic duct. Dissection continued until a critical view was obtained between the cystic duct, the liver, and the infundibulum. The cystic duct was noted to be very narrow and I did not think a cholangiogram catheter wouldn't fit. LFTs were okay so cholangiogram was not performed. 3 clips were placed proximally on the cystic duct, one was placed distally and it was divided. The gallbladder was taken off the liver bed using Bovie cautery. We did encounter a small posterior branch of the cystic artery. This was clipped twice proximally and divided distally with cautery. The gallbladder was taken the rest of the way off the liver bed using the cautery and achieving excellent hemostasis. It was placed in a bag and removed from the abdomen. It was sent to pathology. The liver bed was cauterized to get excellent hemostasis. Clips remain in good position. The area was copiously irrigated. Liver bed was checked again and it was dry. Ports were removed under direct vision. Pneumoperitoneum was released. Supraumbilical fascia was closed by tying the pursestring. All 4 wounds were irrigated and the skin of each was closed with running 4-0 Vicryl subcuticular followed by Dermabond. All counts were correct. Valerie [name] tolerated the procedure well apparent complication and was taken recovery in stable condition.  PATIENT DISPOSITION:  PACU - hemodynamically stable.   Delay start of Pharmacological VTE agent (>24hrs) due to surgical blood loss or risk of bleeding:  no  Violeta GelinasBurke Iasiah Ozment, MD, MPH, FACS Pager: [phone]  9/3/20184:25 PM

## 2017-06-23 NOTE — Anesthesia Postprocedure Evaluation (Signed)
Anesthesia Post Note  Patient: Dannielle HuhSara Guzman Robles  Procedure(s) Performed: Procedure(s) (LRB): LAPAROSCOPIC CHOLECYSTECTOMY (N/A)     Patient location during evaluation: PACU Anesthesia Type: General Level of consciousness: awake and alert Pain management: pain level controlled Vital Signs Assessment: post-procedure vital signs reviewed and stable Respiratory status: spontaneous breathing, nonlabored ventilation and respiratory function stable Cardiovascular status: blood pressure returned to baseline and stable Postop Assessment: no signs of nausea or vomiting Anesthetic complications: no    Last Vitals:  Vitals:   06/23/17 1718 06/23/17 1719  BP:  123/67  Pulse: 71 73  Resp: 19 18  Temp: (!) 36.2 C   SpO2: 100% 99%    Last Pain:  Vitals:   06/23/17 1642  TempSrc:   PainSc: 7                  Beryle Lathehomas E Kindell Strada

## 2017-06-24 ENCOUNTER — Encounter (HOSPITAL_COMMUNITY): Payer: Self-pay | Admitting: General Surgery

## 2017-06-24 LAB — CBC
HEMATOCRIT: 29.2 % — AB (ref 36.0–46.0)
HEMOGLOBIN: 8.7 g/dL — AB (ref 12.0–15.0)
MCH: 21.6 pg — ABNORMAL LOW (ref 26.0–34.0)
MCHC: 29.8 g/dL — AB (ref 30.0–36.0)
MCV: 72.5 fL — ABNORMAL LOW (ref 78.0–100.0)
Platelets: 361 10*3/uL (ref 150–400)
RBC: 4.03 MIL/uL (ref 3.87–5.11)
RDW: 16.7 % — ABNORMAL HIGH (ref 11.5–15.5)
WBC: 9.4 10*3/uL (ref 4.0–10.5)

## 2017-06-24 MED ORDER — AMOXICILLIN-POT CLAVULANATE 875-125 MG PO TABS: 1.0000 | ORAL_TABLET | Freq: Two times a day (BID) | ORAL | 0 refills | Status: DC

## 2017-06-24 MED ORDER — TRAMADOL HCL 50 MG PO TABS: 50.0000 mg | ORAL_TABLET | Freq: Four times a day (QID) | ORAL | Status: DC | PRN

## 2017-06-24 MED ORDER — ACETAMINOPHEN 325 MG PO TABS
650.0000 mg | ORAL_TABLET | Freq: Four times a day (QID) | ORAL | Status: DC
  Administered 2017-06-24: 650 mg via ORAL
  Filled 2017-06-24: qty 2

## 2017-06-24 MED ORDER — OXYCODONE-ACETAMINOPHEN 10-325 MG PO TABS: 1.0000 | ORAL_TABLET | ORAL | 0 refills | Status: DC | PRN

## 2017-06-24 MED ORDER — ACETAMINOPHEN 650 MG RE SUPP: 650.0000 mg | Freq: Four times a day (QID) | RECTAL | Status: DC

## 2017-06-24 NOTE — Progress Notes (Signed)
[name] Progress Note  1 Day Post-Op  Subjective: CC: abdominal pain Patient with RUQ pain, relieved with medication. Having some pain with eating. Denies nausea or vomiting. No flatus. Has been ambulating in room some.  UOP good. VSS.   Objective: Vital signs in last 24 hours: Temp:  [97.1 F (36.2 C)-98.8 F (37.1 C)] 98.6 F (37 C) (09/04 16100614) Pulse Rate:  [53-80] 61 (09/04 0614) Resp:  [10-19] 19 (09/04 0614) BP: (107-126)/(52-99) 112/64 (09/04 0614) SpO2:  [97 %-100 %] 97 % (09/04 0614) Last BM Date: 06/21/17  Intake/Output from previous day: 09/03 0701 - 09/04 0700 In: 2199.3 [P.O.:120; I.V.:1693.3; Blood:336; IV Piggyback:50] Out: 1350 [Urine:1300; Blood:50] Intake/Output this shift: No intake/output data recorded.  PE: Gen:  Alert, NAD, pleasant Card:  Regular rate and rhythm, pedal pulses 2+ BL Pulm:  Normal effort, clear to auscultation bilaterally Abd: Soft, appropriately tender, non-distended, bowel sounds present, no HSM, incisions C/D/I Skin: warm and dry, no rashes  Psych: A&Ox3   Lab Results:   Recent Labs  06/23/17 0419 06/23/17 1426 06/24/17 0426  WBC 7.1  --  9.4  HGB 7.1* 9.2* 8.7*  HCT 24.7* 27.0* 29.2*  PLT 303  --  361   BMET  Recent Labs  06/22/17 1900 06/23/17 0419 06/23/17 1426  NA 136 140 141  K 3.5 3.6 3.6  CL 108 110  --   CO2 20* 23  --   GLUCOSE 98 95 81  BUN 8 7  --   CREATININE 0.58 0.52  --   CALCIUM 8.5* 8.1*  --    PT/INR No results for input(s): LABPROT, INR in the last 72 hours. CMP     Component Value Date/Time   NA 141 06/23/2017 1426   K 3.6 06/23/2017 1426   CL 110 06/23/2017 0419   CO2 23 06/23/2017 0419   GLUCOSE 81 06/23/2017 1426   BUN 7 06/23/2017 0419   CREATININE 0.52 06/23/2017 0419   CALCIUM 8.1 (L) 06/23/2017 0419   PROT 6.4 (L) 06/23/2017 0419   ALBUMIN 3.2 (L) 06/23/2017 0419   AST 12 (L) 06/23/2017 0419   ALT 13 (L) 06/23/2017 0419   ALKPHOS 60 06/23/2017 0419   BILITOT 0.2 (L) 06/23/2017 0419   GFRNONAA >60 06/23/2017 0419   GFRAA >60 06/23/2017 0419   Lipase     Component Value Date/Time   LIPASE 31 06/22/2017 1900    Anti-infectives: Anti-infectives    Start     Dose/Rate Route Frequency Ordered Stop   06/22/17 2100  cefTRIAXone (ROCEPHIN) 2 g in dextrose 5 % 50 mL IVPB     2 g 100 mL/hr over 30 Minutes Intravenous Every 24 hours 06/22/17 2014         Assessment/Plan Chronic Calculous Cholecystitis S/p lap cholecystectomy 06/23/17 Dr. Janee Mornhompson - POD#1 - tolerating diet - encourage ambulation - scheduled tylenol 650 q6h, ibuprofen 600 mg q6h prn, tramadol 50 mg q4h prn, oxy ir 5-10 mg q4h prn  FEN - HH diet VTE - SCDs, lovenox ID - ceftriaxone (9/2>>) can d/c at discharge  Plan: Encourage ambulation. Pain control. Possibly home this afternoon   LOS: 2 days    Wells Valerie [name] , [name] 06/24/2017, 8:45 AM Pager: [phone] Consults: [phone] Mon-Fri 7:00 am-4:30 pm Sat-Sun 7:00 am-11:30 am

## 2017-06-24 NOTE — Progress Notes (Signed)
Interpreter Wyvonnia DuskyGraciela Namihira for Kohl'sCarol RN

## 2017-06-24 NOTE — Discharge Summary (Signed)
[facility] Discharge Summary   Patient ID: Valerie [name] MRN: [id number] DOB/AGE: 1985-12-14 31 y.o.  Admit date: 06/22/2017 Discharge date: 06/24/2017  Admitting Diagnosis: Chronic cholecystitis Cholelithiasis  Discharge Diagnosis Chronic cholecystitis with cholelithiasis S/p laparoscopic cholecystectomy  Consultants None  Imaging: RUQ US: Cholelithiasis.  No ultrasound evidence of acute cholecystitis  Procedures Dr. Janee Mornhompson (06/23/17) - Laparoscopic Cholecystectomy    Hospital Course:  Patient is a 31 y/o female [facility] .  Workup showed cholelithiasis.  Patient was admitted and underwent procedure listed above.  Tolerated procedure well and was transferred to the floor.  Diet was advanced as tolerated.  On POD#1, the patient was voiding well, tolerating diet, ambulating well, pain well controlled, vital signs stable, incisions c/d/i and felt stable for discharge home.  Patient will follow up in our office in 2 weeks and knows to call with questions or concerns. She will call to confirm appointment date/time.     Allergies as of 06/24/2017   No Known Allergies     Medication List    STOP taking these medications   cephALEXin 500 MG capsule Commonly known as:  KEFLEX   morphine 15 MG tablet Commonly known as:  MSIR   Prenatal Vitamins 0.8 MG tablet     TAKE these medications   albuterol 108 (90 Base) MCG/ACT inhaler Commonly known as:  PROVENTIL HFA;VENTOLIN HFA Inhale 1-2 puffs into the lungs every 6 (six) hours as needed for wheezing or shortness of breath.   amoxicillin-clavulanate 875-125 MG tablet Commonly known as:  AUGMENTIN Take 1 tablet by mouth every 12 (twelve) hours.   ibuprofen 200 MG tablet Commonly known as:  ADVIL,MOTRIN Take 400 mg by mouth every 6 (six) hours as needed for headache or mild pain. What changed:  Another medication with the same name was removed. Continue taking this medication, and  follow the directions you see here.   ondansetron 4 MG disintegrating tablet Commonly known as:  ZOFRAN ODT Take 1 tablet (4 mg total) by mouth every 8 (eight) hours as needed for nausea or vomiting.   oxyCODONE-acetaminophen 10-325 MG tablet Commonly known as:  PERCOCET Take 1 tablet by mouth every 4 (four) hours as needed for pain.            Discharge Care Instructions        Start     Ordered   06/24/17 0000  oxyCODONE-acetaminophen (PERCOCET) 10-325 MG tablet  Every 4 hours PRN    Question:  Supervising Provider  Answer:  Almond LintBYERLY, FAERA   06/24/17 0935   06/24/17 0000  amoxicillin-clavulanate (AUGMENTIN) 875-125 MG tablet  Every 12 hours     06/24/17 0935       Follow-up Information    [facility] Call.   Specialty:  General Surgery Why:  Call to confirm appointment date/time. Please arrive 30 min prior to appointment time. Bring photo ID and insurance information.  Contact information: [address] [place] [address] [phone]           Signed: Wells GuilesKelly Rayburn , [facility] 06/24/2017, 10:32 AM Pager: [phone] Consults: [phone] Mon-Fri 7:00 am-4:30 pm Sat-Sun 7:00 am-11:30 am

## 2017-06-24 NOTE — Discharge Instructions (Signed)

## 2017-06-25 LAB — TYPE AND SCREEN
ABO/RH(D): O POS
ANTIBODY SCREEN: NEGATIVE
UNIT DIVISION: 0
UNIT DIVISION: 0
UNIT DIVISION: 0

## 2017-06-25 LAB — BPAM RBC
BLOOD PRODUCT EXPIRATION DATE: 201810032359
Blood Product Expiration Date: 201810022359
Blood Product Expiration Date: 201810032359
ISSUE DATE / TIME: 201809030950
UNIT TYPE AND RH: 5100
UNIT TYPE AND RH: 5100
UNIT TYPE AND RH: 5100

## 2017-06-27 ENCOUNTER — Emergency Department (HOSPITAL_COMMUNITY)
Admission: EM | Admit: 2017-06-27 | Discharge: 2017-06-27 | Disposition: A | Discharge status: Home / Self Care | Payer: BLUE CROSS/BLUE SHIELD | Attending: Emergency Medicine | Admitting: Emergency Medicine

## 2017-06-27 ENCOUNTER — Emergency Department (HOSPITAL_COMMUNITY): Payer: BLUE CROSS/BLUE SHIELD

## 2017-06-27 ENCOUNTER — Encounter (HOSPITAL_COMMUNITY): Payer: Self-pay | Admitting: Emergency Medicine

## 2017-06-27 DIAGNOSIS — R109 Unspecified abdominal pain: Secondary | ICD-10-CM | POA: Insufficient documentation

## 2017-06-27 DIAGNOSIS — R0602 Shortness of breath: Secondary | ICD-10-CM | POA: Insufficient documentation

## 2017-06-27 DIAGNOSIS — J45909 Unspecified asthma, uncomplicated: Secondary | ICD-10-CM | POA: Diagnosis not present

## 2017-06-27 DIAGNOSIS — R0789 Other chest pain: Secondary | ICD-10-CM | POA: Insufficient documentation

## 2017-06-27 LAB — CBC
HEMATOCRIT: 31.6 % — AB (ref 36.0–46.0)
Hemoglobin: 9.8 g/dL — ABNORMAL LOW (ref 12.0–15.0)
MCH: 22.3 pg — ABNORMAL LOW (ref 26.0–34.0)
MCHC: 31 g/dL (ref 30.0–36.0)
MCV: 71.8 fL — ABNORMAL LOW (ref 78.0–100.0)
PLATELETS: 443 10*3/uL — AB (ref 150–400)
RBC: 4.4 MIL/uL (ref 3.87–5.11)
RDW: 17.5 % — AB (ref 11.5–15.5)
WBC: 11.7 10*3/uL — AB (ref 4.0–10.5)

## 2017-06-27 LAB — BASIC METABOLIC PANEL
Anion gap: 7 (ref 5–15)
BUN: 5 mg/dL — AB (ref 6–20)
CO2: 26 mmol/L (ref 22–32)
Calcium: 9.3 mg/dL (ref 8.9–10.3)
Chloride: 104 mmol/L (ref 101–111)
Creatinine, Ser: 0.5 mg/dL (ref 0.44–1.00)
Glucose, Bld: 97 mg/dL (ref 65–99)
POTASSIUM: 3.6 mmol/L (ref 3.5–5.1)
SODIUM: 137 mmol/L (ref 135–145)

## 2017-06-27 LAB — I-STAT TROPONIN, ED: Troponin i, poc: 0 ng/mL (ref 0.00–0.08)

## 2017-06-27 LAB — I-STAT BETA HCG BLOOD, ED (MC, WL, AP ONLY)

## 2017-06-27 MED ORDER — AMOXICILLIN-POT CLAVULANATE 875-125 MG PO TABS: 1.0000 | ORAL_TABLET | Freq: Two times a day (BID) | ORAL | 0 refills | Status: DC

## 2017-06-27 MED ORDER — MORPHINE SULFATE (PF) 4 MG/ML IV SOLN
4.0000 mg | Freq: Once | INTRAVENOUS | Status: AC
  Administered 2017-06-27: 4 mg via INTRAVENOUS
  Filled 2017-06-27: qty 1

## 2017-06-27 MED ORDER — ONDANSETRON HCL 4 MG/2ML IJ SOLN
4.0000 mg | Freq: Once | INTRAMUSCULAR | Status: AC
  Administered 2017-06-27: 4 mg via INTRAVENOUS
  Filled 2017-06-27: qty 2

## 2017-06-27 MED ORDER — IOPAMIDOL (ISOVUE-370) INJECTION 76%
INTRAVENOUS | Status: AC
  Administered 2017-06-27: 100 mL
  Filled 2017-06-27: qty 100

## 2017-06-27 MED ORDER — KETOROLAC TROMETHAMINE 30 MG/ML IJ SOLN
15.0000 mg | Freq: Once | INTRAMUSCULAR | Status: AC
  Administered 2017-06-27: 15 mg via INTRAVENOUS
  Filled 2017-06-27: qty 1

## 2017-06-27 NOTE — ED Provider Notes (Addendum)
[facility]-EMERGENCY DEPT Provider Note   CSN: [id number] Arrival date & time: 06/27/17  0106     History   Chief Complaint Chief Complaint  Patient presents with  . Chest Pain    HPI Valerie [name] is a [age] y.o. female.  31 yo F with a chief complaint of chest pain. The patient had sudden onset chest pain about 8 hours ago. Describes it as sharp and shooting. Worse with movement palpation and deep breathing. She does have a cholecystectomy 3 days ago. Denied any chest pain until now. Endorses shortness of breath. Denies leg swelling. Denies history of PE or DVT.   The history is provided by the patient.  Chest Pain   This is a new problem. The current episode started 6 to 12 hours ago. The problem occurs constantly. The problem has not changed since onset.The pain is associated with movement, coughing and breathing. The pain is present in the substernal region. The pain is at a severity of 10/10. The pain is severe. The quality of the pain is described as sharp. The pain does not radiate. Duration of episode(s) is 8 hours. The symptoms are aggravated by certain positions and deep breathing. Associated symptoms include abdominal pain and shortness of breath. Pertinent negatives include no dizziness, no fever, no headaches, no nausea, no palpitations and no vomiting. She has tried nothing for the symptoms. The treatment provided no relief.    Past Medical History:  Diagnosis Date  . Asthma   . PONV (postoperative nausea and vomiting)     Patient Active Problem List   Diagnosis Date Noted  . Chronic cholecystitis with calculus 06/22/2017  . Status post repeat low transverse cesarean section 08/31/2016  . History of unexplained stillbirth 08/22/2016  . Sterilization consult 05/27/2016  . Asthma affecting pregnancy, antepartum 04/29/2016  . Supervision of high risk pregnancy, antepartum 04/29/2016  . Group B Streptococcus carrier, +RV culture, currently pregnant 04/29/2016  . UTI in  pregnancy 04/29/2016  . History of 2 cesarean sections 04/29/2016    Past Surgical History:  Procedure Laterality Date  . CESAREAN SECTION    . CESAREAN SECTION WITH BILATERAL TUBAL LIGATION Bilateral 08/29/2016   Procedure: CESAREAN SECTION WITH BILATERAL TUBAL LIGATION;  Surgeon: Adam PhenixJames G Arnold, MD;  [facility];  Service: Obstetrics;  Laterality: Bilateral;  . CHOLECYSTECTOMY N/A 06/23/2017   Procedure: LAPAROSCOPIC CHOLECYSTECTOMY;  Surgeon: Violeta Gelinashompson, Burke, MD;  [facility];  Service: General;  Laterality: N/A;  . OVARIAN CYST REMOVAL      OB History    Gravida Para Term Preterm AB Living   5 4 3 1 1 3    SAB TAB Ectopic Multiple Live Births   1 0 0 0 3       Home Medications    Prior to Admission medications   Medication Sig Start Date End Date Taking? Authorizing Provider  amoxicillin-clavulanate (AUGMENTIN) 875-125 MG tablet Take 1 tablet by mouth every 12 (twelve) hours. 06/24/17 07/01/17 Yes Rayburn, Alphonsus SiasKelly A, PA-C  oxyCODONE-acetaminophen (PERCOCET) 10-325 MG tablet Take 1 tablet by mouth every 4 (four) hours as needed for pain. 06/24/17  Yes Rayburn, Tresa EndoKelly A, PA-C  amoxicillin-clavulanate (AUGMENTIN) 875-125 MG tablet Take 1 tablet by mouth 2 (two) times daily. 06/27/17   Melene PlanFloyd, Naseer Hearn, DO  ondansetron (ZOFRAN ODT) 4 MG disintegrating tablet Take 1 tablet (4 mg total) by mouth every 8 (eight) hours as needed for nausea or vomiting. 06/21/17   Melene PlanFloyd, Sera Hitsman, DO    Family History No  family history on file.  Social History Social History  Substance Use Topics  . Smoking status: Never Smoker  . Smokeless tobacco: Never Used  . Alcohol use No     Allergies   Patient has no known allergies.   Review of Systems Review of Systems  Constitutional: Negative for chills and fever.  HENT: Negative for congestion and rhinorrhea.   Eyes: Negative for redness and visual disturbance.  Respiratory: Positive for shortness of breath. Negative for wheezing.     Cardiovascular: Positive for chest pain. Negative for palpitations.  Gastrointestinal: Positive for abdominal pain. Negative for nausea and vomiting.  Genitourinary: Negative for dysuria and urgency.  Musculoskeletal: Negative for arthralgias and myalgias.  Skin: Negative for pallor and wound.  Neurological: Negative for dizziness and headaches.     Physical Exam Updated Vital Signs BP 112/66 (BP Location: Right Arm)   Pulse 78   Temp 98.1 F (36.7 C) (Oral)   Resp 16   Ht  (1.676 m)   Wt 95.3 kg (210 lb)   LMP 06/05/2017   SpO2 99%   BMI 33.89 kg/m   Physical Exam  Constitutional: She is oriented to person, place, and time. She appears well-developed and well-nourished. No distress.  HENT:  Head: Normocephalic and atraumatic.  Eyes: Pupils are equal, round, and reactive to light. EOM are normal.  Neck: Normal range of motion. Neck supple.  Cardiovascular: Normal rate and regular rhythm.  Exam reveals no gallop and no friction rub.   No murmur heard. Pulmonary/Chest: Effort normal. She has no wheezes. She has no rales. She exhibits tenderness.  Abdominal: Soft. She exhibits no distension and no mass. There is tenderness. There is no guarding.  Musculoskeletal: She exhibits no edema or tenderness.  Neurological: She is alert and oriented to person, place, and time.  Skin: Skin is warm and dry. She is not diaphoretic.  Psychiatric: She has a normal mood and affect. Her behavior is normal.  Nursing note and vitals reviewed.    ED Treatments / Results  Labs (all labs ordered are listed, but only abnormal results are displayed) Labs Reviewed  BASIC METABOLIC PANEL - Abnormal; Notable for the following:       Result Value   BUN 5 (*)    All other components within normal limits  CBC - Abnormal; Notable for the following:    WBC 11.7 (*)    Hemoglobin 9.8 (*)    HCT 31.6 (*)    MCV 71.8 (*)    MCH 22.3 (*)    RDW 17.5 (*)    Platelets 443 (*)    All other  components within normal limits  I-STAT TROPONIN, ED  I-STAT BETA HCG BLOOD, ED ([facility], [facility], [facility] ONLY)    EKG  EKG Interpretation None       Radiology Dg Chest 2 View  Result Date: 06/27/2017 CLINICAL DATA:  Gallbladder removed on Tuesday. Now begins to have centralized chest pain and nausea. EXAM: CHEST  2 VIEW COMPARISON:  None. FINDINGS: Shallow inspiration with linear atelectasis in the lung bases. Blunting of the costophrenic angles may indicate small pleural effusions. Normal heart size and pulmonary vascularity. No pneumothorax. Mediastinal contours appear intact. IMPRESSION: Linear atelectasis in the lung bases with small bilateral pleural effusions. Electronically Signed   By: Burman Nieves M.D.   On: 06/27/2017 02:14   Ct Angio Chest Pe W And/or Wo Contrast  Result Date: 06/27/2017 CLINICAL DATA:  Gallbladder was removed on Tuesday. Today patient  began to have centralized chest pain and nausea. High pretest clinical probability of pulmonary embolus. EXAM: CT ANGIOGRAPHY CHEST WITH CONTRAST TECHNIQUE: Multidetector CT imaging of the chest was performed using the standard protocol during bolus administration of intravenous contrast. Multiplanar CT image reconstructions and MIPs were obtained to evaluate the vascular anatomy. CONTRAST:  66 mL Isovue 370 COMPARISON:  None. FINDINGS: Cardiovascular: Satisfactory opacification of the pulmonary arteries to the segmental level. No evidence of pulmonary embolism. Normal heart size. No pericardial effusion. Mediastinum/Nodes: No enlarged mediastinal, hilar, or axillary lymph nodes. Thyroid gland, trachea, and esophagus demonstrate no significant findings. Lungs/Pleura: Consolidation in both lung bases may represent atelectasis or pneumonia. Tiny bilateral pleural effusions. No pneumothorax. Airways appear patent. Upper Abdomen: No acute abnormality. Musculoskeletal: No chest wall abnormality. No acute or significant osseous findings. Review of the MIP  images confirms the above findings. IMPRESSION: No evidence of significant pulmonary embolus. Consolidation in both lung bases may represent atelectasis or pneumonia. Minimal bilateral pleural effusions. Electronically Signed   By: Burman Nieves M.D.   On: 06/27/2017 05:45    Procedures Procedures (including critical care time)  Medications Ordered in ED Medications  morphine 4 MG/ML injection 4 mg (4 mg Intravenous Given 06/27/17 0445)  ondansetron (ZOFRAN) injection 4 mg (4 mg Intravenous Given 06/27/17 0446)  ketorolac (TORADOL) 30 MG/ML injection 15 mg (15 mg Intravenous Given 06/27/17 0446)  iopamidol (ISOVUE-370) 76 % injection (100 mLs  Contrast Given 06/27/17 0529)     Initial Impression / Assessment and Plan / ED Course  I have reviewed the triage vital signs and the nursing notes.  Pertinent labs & imaging results that were available during my care of the patient were reviewed by me and considered in my medical decision making (see chart for details).     31 yo F With a chief complaint of chest pain. Feel the patient at high risk for PE as she just had abdominal surgery. She had sudden onset of symptoms. Is a bit atypical however because she has reproducibility with palpation of the chest wall. Will obtain a CT scan.  Ct negative for PE.  Bilateral consolidation at bases, likely atelectasis, though with ? Pna, will start on abx. Med list shows patient already on abx, though she denies being on this med. D/c home.   6:23 AM:  I have discussed the diagnosis/risks/treatment options with the patient and family and believe the pt to be eligible for discharge home to follow-up with PCP. We also discussed returning to the ED immediately if new or worsening sx occur. We discussed the sx which are most concerning (e.g., sudden worsening pain, fever, inability to tolerate by mouth) that necessitate immediate return. Medications administered to the patient during their visit and any new  prescriptions provided to the patient are listed below.  Medications given during this visit Medications  morphine 4 MG/ML injection 4 mg (4 mg Intravenous Given 06/27/17 0445)  ondansetron (ZOFRAN) injection 4 mg (4 mg Intravenous Given 06/27/17 0446)  ketorolac (TORADOL) 30 MG/ML injection 15 mg (15 mg Intravenous Given 06/27/17 0446)  iopamidol (ISOVUE-370) 76 % injection (100 mLs  Contrast Given 06/27/17 0529)     The patient appears reasonably screen and/or stabilized for discharge and I doubt any other medical condition or other [facility] requiring further screening, evaluation, or treatment in the ED at this time prior to discharge.    Final Clinical Impressions(s) / ED Diagnoses   Final diagnoses:  Chest wall pain    New  Prescriptions Discharge Medication List as of 06/27/2017  5:51 AM    START taking these medications   Details  !! amoxicillin-clavulanate (AUGMENTIN) 875-125 MG tablet Take 1 tablet by mouth 2 (two) times daily., Starting Fri 06/27/2017, Print     !! - Potential duplicate medications found. Please discuss with provider.       Melene Plan, DO 06/27/17 1610    Melene Plan, DO 06/27/17 [phone]

## 2017-06-27 NOTE — ED Triage Notes (Addendum)
Pt had her gallbladder removed on Tuesday, today around 9pm she began to have centralized chest pain and nausea.  Denies sob, loc, dizziness.  Incision sites do not appear to be infected at this time.

## 2017-06-27 NOTE — Discharge Instructions (Signed)
Take 4 over the counter ibuprofen tablets 3 times a day or 2 over-the-counter naproxen tablets twice a day for pain. Also take tylenol 1000mg(2 extra strength) four times a day.    

## 2017-06-27 NOTE — ED Notes (Signed)
Patient transported to CT scan . 

## 2017-09-19 IMAGING — CR DG CHEST 2V
2 series · 2 of 2 positions shown · non-contrast
Comparison: None.

CLINICAL DATA: Gallbladder removed on [REDACTED]. Now begins to have
centralized chest pain and nausea.

EXAM:
CHEST  2 VIEW

[chest pa]
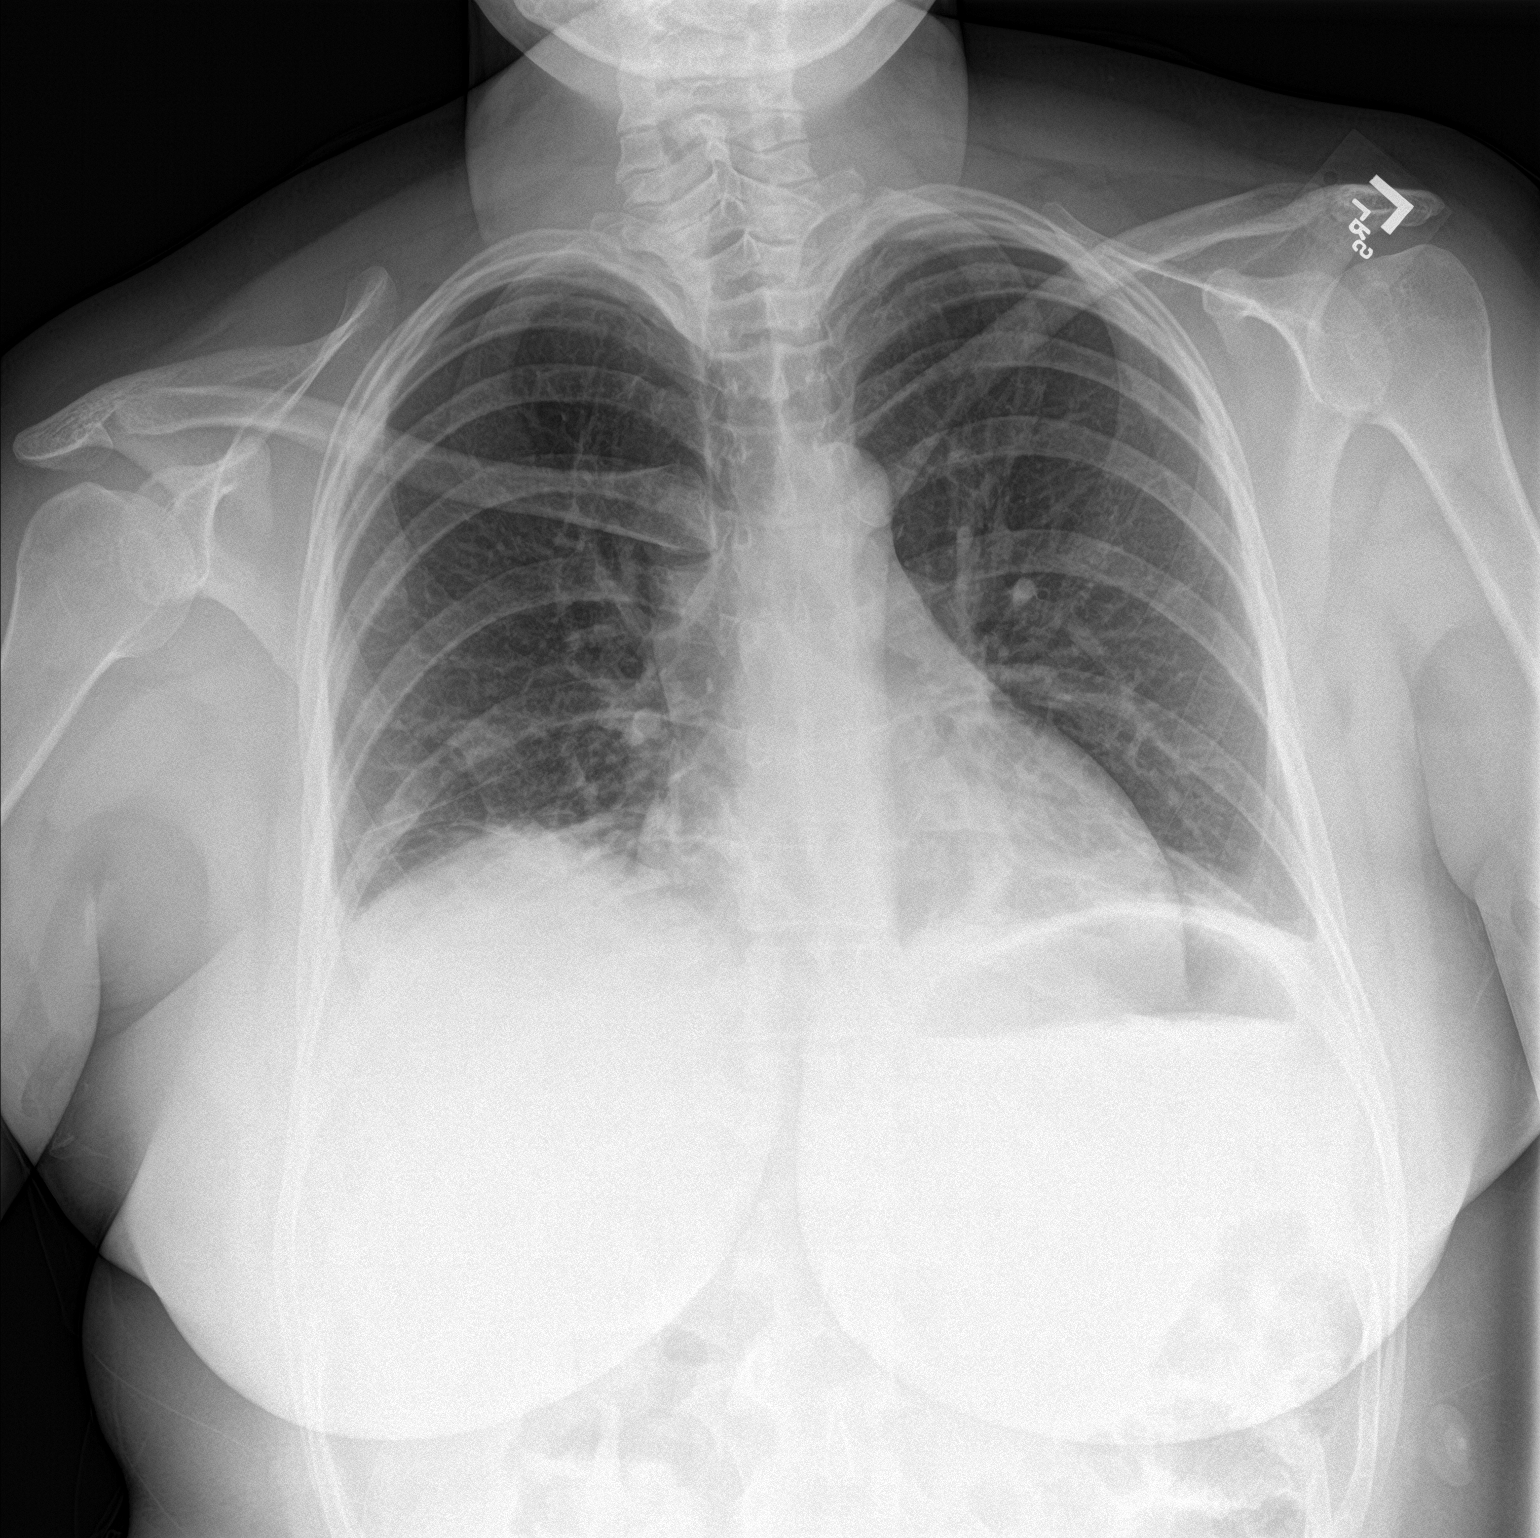

[chest lat]
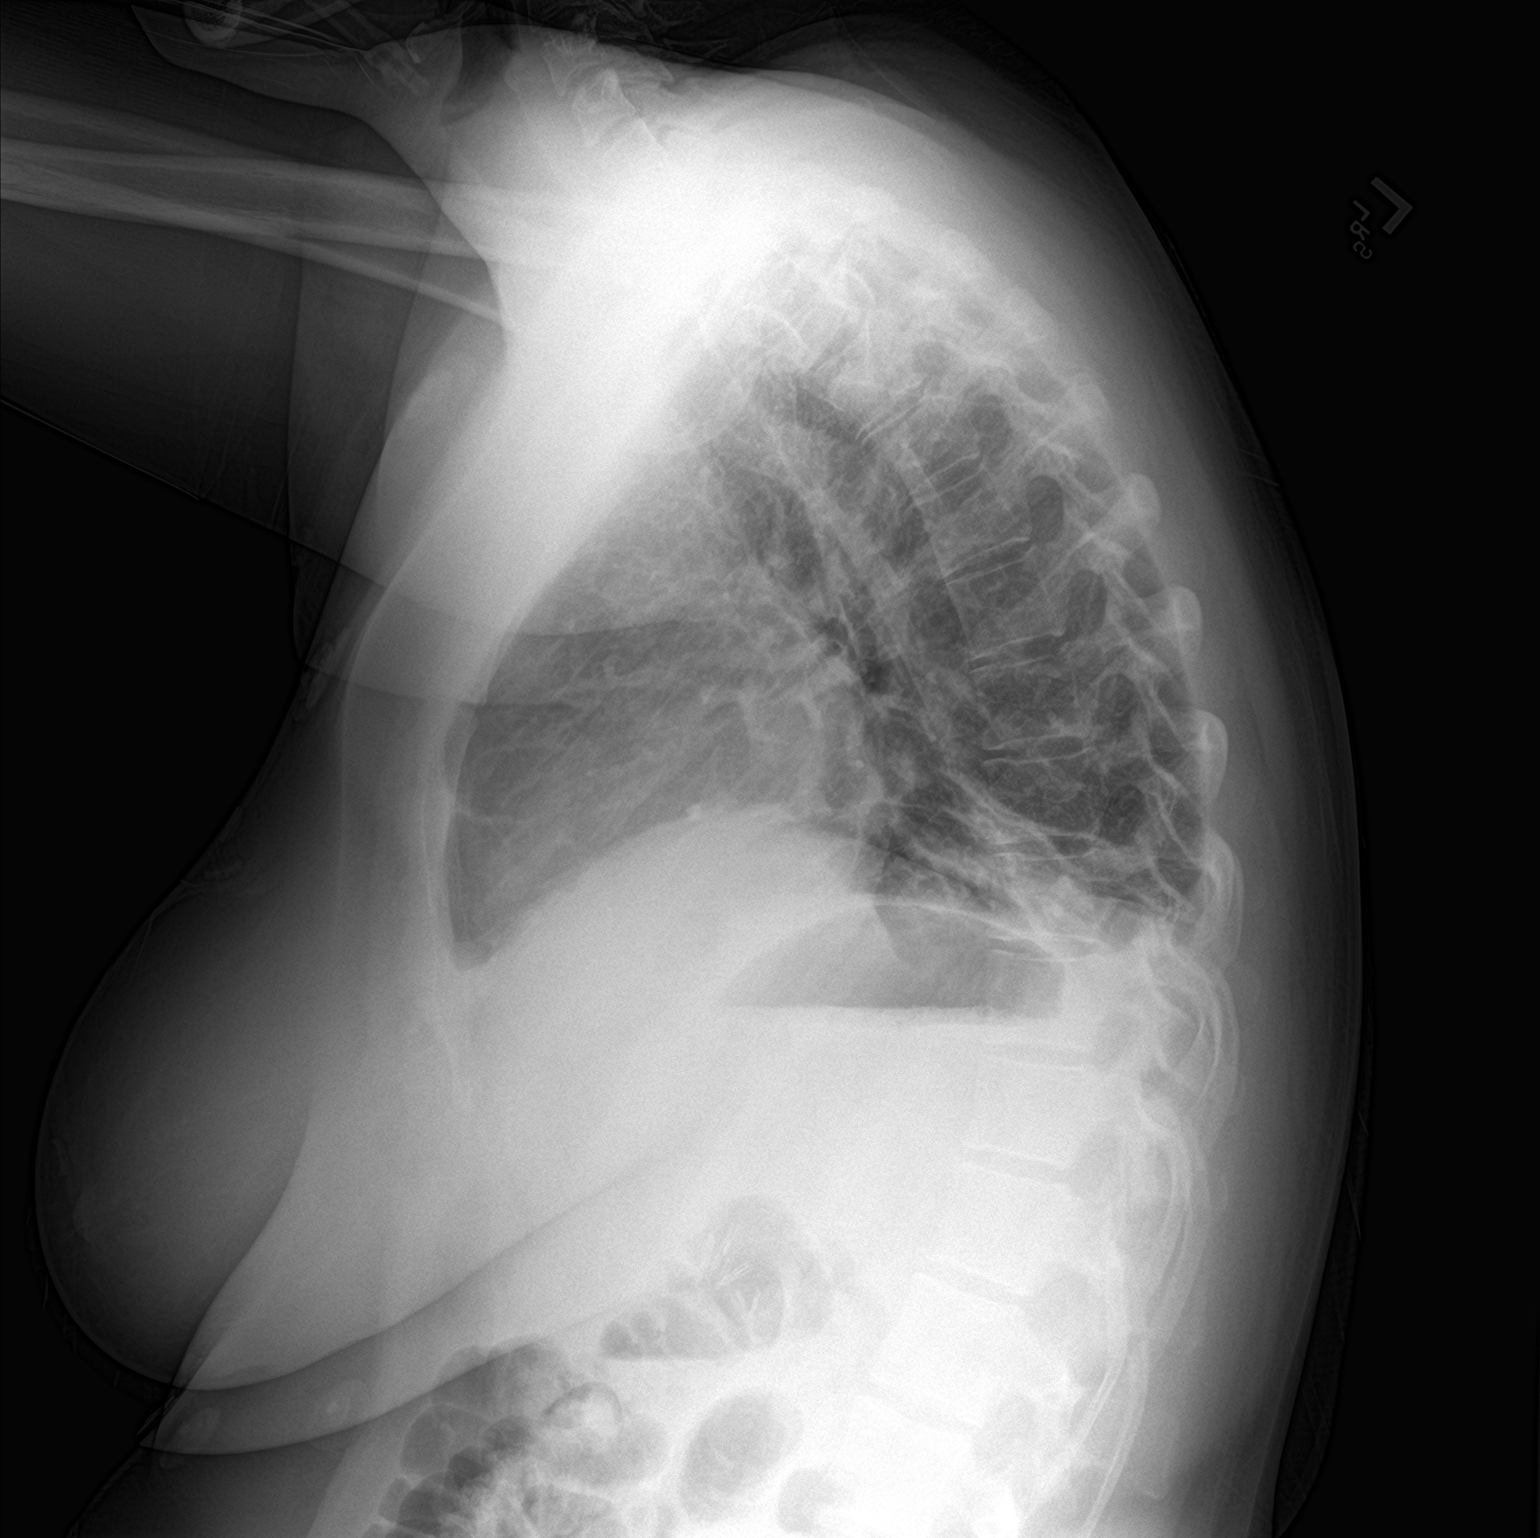

[2 of 2 positions shown; findings below may reference images not displayed]

FINDINGS: Shallow inspiration with linear atelectasis in the lung bases.
Blunting of the costophrenic angles may indicate small pleural
effusions. Normal heart size and pulmonary vascularity. No
pneumothorax. Mediastinal contours appear intact.
IMPRESSION: Linear atelectasis in the lung bases with small bilateral pleural
effusions.

## 2017-09-27 ENCOUNTER — Other Ambulatory Visit: Payer: Self-pay

## 2017-09-27 ENCOUNTER — Encounter (HOSPITAL_COMMUNITY): Payer: Self-pay

## 2017-09-27 ENCOUNTER — Inpatient Hospital Stay (HOSPITAL_COMMUNITY)
Admission: AD | Admit: 2017-09-27 | Discharge: 2017-09-27 | Disposition: A | Discharge status: Home / Self Care | Payer: BLUE CROSS/BLUE SHIELD | Source: Ambulatory Visit | Attending: Obstetrics and Gynecology | Admitting: Obstetrics and Gynecology

## 2017-09-27 DIAGNOSIS — N939 Abnormal uterine and vaginal bleeding, unspecified: Secondary | ICD-10-CM | POA: Diagnosis present

## 2017-09-27 DIAGNOSIS — N938 Other specified abnormal uterine and vaginal bleeding: Secondary | ICD-10-CM

## 2017-09-27 DIAGNOSIS — D500 Iron deficiency anemia secondary to blood loss (chronic): Secondary | ICD-10-CM

## 2017-09-27 DIAGNOSIS — Z3202 Encounter for pregnancy test, result negative: Secondary | ICD-10-CM | POA: Diagnosis not present

## 2017-09-27 LAB — CBC WITH DIFFERENTIAL/PLATELET
BASOS ABS: 0 10*3/uL (ref 0.0–0.1)
Basophils Relative: 0 %
EOS PCT: 8 %
Eosinophils Absolute: 0.8 10*3/uL — ABNORMAL HIGH (ref 0.0–0.7)
HEMATOCRIT: 28.1 % — AB (ref 36.0–46.0)
Hemoglobin: 8.4 g/dL — ABNORMAL LOW (ref 12.0–15.0)
LYMPHS PCT: 23 %
Lymphs Abs: 2.2 10*3/uL (ref 0.7–4.0)
MCH: 21.9 pg — ABNORMAL LOW (ref 26.0–34.0)
MCHC: 29.9 g/dL — ABNORMAL LOW (ref 30.0–36.0)
MCV: 73.4 fL — AB (ref 78.0–100.0)
Monocytes Absolute: 0.2 10*3/uL (ref 0.1–1.0)
Monocytes Relative: 2 %
NEUTROS ABS: 6.3 10*3/uL (ref 1.7–7.7)
Neutrophils Relative %: 67 %
PLATELETS: 416 10*3/uL — AB (ref 150–400)
RBC: 3.83 MIL/uL — AB (ref 3.87–5.11)
RDW: 16.7 % — ABNORMAL HIGH (ref 11.5–15.5)
WBC: 9.5 10*3/uL (ref 4.0–10.5)

## 2017-09-27 LAB — POCT PREGNANCY, URINE: Preg Test, Ur: NEGATIVE

## 2017-09-27 LAB — URINALYSIS, ROUTINE W REFLEX MICROSCOPIC
BACTERIA UA: NONE SEEN
Bilirubin Urine: NEGATIVE
Glucose, UA: NEGATIVE mg/dL
Ketones, ur: NEGATIVE mg/dL
Nitrite: NEGATIVE
PROTEIN: 100 mg/dL — AB
Specific Gravity, Urine: 1.029 (ref 1.005–1.030)
pH: 5 (ref 5.0–8.0)

## 2017-09-27 MED ORDER — MEGESTROL ACETATE 40 MG PO TABS: 40.0000 mg | ORAL_TABLET | Freq: Three times a day (TID) | ORAL | 1 refills | Status: DC

## 2017-09-27 MED ORDER — MEGESTROL ACETATE 40 MG PO TABS
40.0000 mg | ORAL_TABLET | Freq: Every day | ORAL | Status: DC
  Administered 2017-09-27: 40 mg via ORAL
  Filled 2017-09-27 (×2): qty 1

## 2017-09-27 NOTE — MAU Note (Signed)
Having heavy bleeding.  Started 11/29. Not time for period, LMP was 11/9.  Changing every 30 min.  When she stands up, it gushes.

## 2017-09-27 NOTE — MAU Provider Note (Signed)
History   [age] female not pregnant ( had BTL) in with vag bleeding since 09/18/17. Pt states she had period first week in November. States her periods are always regular and this has never happened before. Bleeding became heavy last night and pt is concerned she is bleeding too much.  CSN: [id number]  Arrival date & time 09/27/17  1716   None     Chief Complaint  Patient presents with  . Vaginal Bleeding    HPI  Past Medical History:  Diagnosis Date  . Asthma   . PONV (postoperative nausea and vomiting)     Past Surgical History:  Procedure Laterality Date  . CESAREAN SECTION    . CESAREAN SECTION WITH BILATERAL TUBAL LIGATION Bilateral 08/29/2016   Procedure: CESAREAN SECTION WITH BILATERAL TUBAL LIGATION;  Surgeon: Adam PhenixJames G Arnold, MD;  [facility];  Service: Obstetrics;  Laterality: Bilateral;  . CHOLECYSTECTOMY N/A 06/23/2017   Procedure: LAPAROSCOPIC CHOLECYSTECTOMY;  Surgeon: Violeta Gelinashompson, Burke, MD;  [facility];  Service: General;  Laterality: N/A;  . OVARIAN CYST REMOVAL      No family history on file.  Social History   Tobacco Use  . Smoking status: Never Smoker  . Smokeless tobacco: Never Used  Substance Use Topics  . Alcohol use: No  . Drug use: No    OB History    Gravida Para Term Preterm AB Living   5 4 3 1 1 3    SAB TAB Ectopic Multiple Live Births   1 0 0 0 3      Review of Systems  Constitutional: Negative.   HENT: Negative.   Eyes: Negative.   Respiratory: Negative.   Cardiovascular: Negative.   Gastrointestinal: Negative.   Endocrine: Negative.   Genitourinary: Positive for vaginal bleeding.  Musculoskeletal: Negative.   Skin: Negative.   Allergic/Immunologic: Negative.   Neurological: Negative.   Hematological: Negative.   Psychiatric/Behavioral: Negative.     Allergies  Patient has no known allergies.  Home Medications    BP (!) 111/55 (BP Location: Right Arm)   Pulse 78   Temp 98.4 F (36.9 C) (Oral)    Resp 16   Wt 208 lb 12 oz (94.7 kg)   LMP 09/18/2017   SpO2 100%   BMI 33.69 kg/m   Physical Exam  Constitutional: She is oriented to person, place, and time. She appears well-developed and well-nourished.  HENT:  Head: Normocephalic.  Neck: Normal range of motion.  Cardiovascular: Normal rate, regular rhythm, normal heart sounds and intact distal pulses.  Pulmonary/Chest: Effort normal and breath sounds normal.  Abdominal: Soft. Bowel sounds are normal.  Genitourinary: Vagina normal and uterus normal.  Musculoskeletal: Normal range of motion.  Neurological: She is alert and oriented to person, place, and time. She has normal reflexes.  Skin: Skin is warm and dry.  Psychiatric: She has a normal mood and affect. Her behavior is normal. Judgment and thought content normal.    MAU Course  Procedures (including critical care time)  Labs Reviewed  URINALYSIS, ROUTINE W REFLEX MICROSCOPIC  CBC WITH DIFFERENTIAL/PLATELET  POCT PREGNANCY, URINE   No results found. Results for orders placed or performed during the hospital encounter of 09/27/17 (from the past 24 hour(s))  CBC with Differential     Status: Abnormal   Collection Time: 09/27/17  5:35 PM  Result Value Ref Range   WBC 9.5 4.0 - 10.5 K/uL   RBC 3.83 (L) 3.87 - 5.11 MIL/uL   Hemoglobin 8.4 (L) 12.0 -  15.0 g/dL   HCT 16.128.1 (L) 09.636.0 - 04.546.0 %   MCV 73.4 (L) 78.0 - 100.0 fL   MCH 21.9 (L) 26.0 - 34.0 pg   MCHC 29.9 (L) 30.0 - 36.0 g/dL   RDW 40.916.7 (H) 81.111.5 - 91.415.5 %   Platelets 416 (H) 150 - 400 K/uL   Neutrophils Relative % 67 %   Neutro Abs 6.3 1.7 - 7.7 K/uL   Lymphocytes Relative 23 %   Lymphs Abs 2.2 0.7 - 4.0 K/uL   Monocytes Relative 2 %   Monocytes Absolute 0.2 0.1 - 1.0 K/uL   Eosinophils Relative 8 %   Eosinophils Absolute 0.8 (H) 0.0 - 0.7 K/uL   Basophils Relative 0 %   Basophils Absolute 0.0 0.0 - 0.1 K/uL  Pregnancy, urine POC     Status: None   Collection Time: 09/27/17  5:41 PM  Result Value Ref Range    Preg Test, Ur NEGATIVE NEGATIVE    1. DUB (dysfunctional uterine bleeding)       MDM  VSS, sterile spec exam sm amt dark vag bleeding at present. hgb 8.4. Will treat with megace and to follow up in GYN clinic. Message sent. Will d/c home on megace and FESO4.

## 2017-09-27 NOTE — Discharge Instructions (Signed)
Anemia inespecfica (Anemia, Nonspecific) La anemia es una enfermedad en la que la concentracin de glbulos rojos o el nivel de hemoglobina en la sangre estn por debajo de lo normal. La hemoglobina es la sustancia de los glbulos rojos que lleva el oxgeno a todo el cuerpo. La anemia da como resultado que los tejidos no reciban la cantidad suficiente de oxgeno. CAUSAS Las causas ms frecuentes de anemia son:  Sangrado excesivo. El sangrado puede ser interno o externo. Incluye sangrado excesivo debido al perodo (en las mujeres) o por los intestinos.  Dficit nutricional.  Enfermedad renal, tiroidea o heptica crnicas.  Enfermedades de la mdula sea que disminuyen la produccin de glbulos rojos.  Cncer Sharlyne Pacasy tratamientos para Management consultantel cncer.  VIH, sida y sus tratamientos.  Trastornos del bazo que aumentan la destruccin de glbulos rojos.  Enfermedades de Clear Channel Communicationsla sangre.  Destruccin excesiva de glbulos rojos debido a una infeccin, a medicamentos y a Chartered loss adjusterenfermedades autoinmunes. SIGNOS Y SNTOMAS  Debilidad leve.  Mareos.  Dolor de Turkmenistancabeza.  Palpitaciones.  Falta de aire, especialmente con el ejercicio.  Palidez.  Sensibilidad al fro.  Indigestin.  Nuseas.  Dificultad para dormir.  Dificultad para concentrarse. Los sntomas pueden ocurrir repentinamente o pueden Medical illustratordesarrollarse lentamente. DIAGNSTICO Con frecuencia es necesario realizar anlisis de Liberty Mediasangre adicionales. Estos ayudan al profesional a Futures traderdeterminar el mejor tratamiento. Su mdico controlar la materia fecal para Engineer, manufacturingdetectar la presencia de Oyenssangre y buscar otras causas de prdida de Moundridgesangre. TRATAMIENTO El tratamiento vara segn la causa de la anemia. Las opciones de tratamiento son:  Suplementos de hierro, vitamina B12, o cido flico.  Medicamentos con hormonas.  Transfusin de Walthamsangre. Ser necesaria en los casos de prdida de Newarksangre grave.  Hospitalizacin. Ser necesaria si la prdida de sangre es continua  y significativa.  Cambios en la dieta.  Extirpacin del bazo. INSTRUCCIONES PARA EL CUIDADO EN EL HOGAR Cumpla con todas las visitas de control. Generalmente demora varias semanas corregir la anemia, y es muy importante que el mdico controle su enfermedad y su respuesta al Oglethorpetratamiento. SOLICITE ATENCIN MDICA DE INMEDIATO SI:  Siente debilidad extrema, falta de aire o dolor en el pecho.  Se siente mareado o tiene dificultad para concentrarse.  Tiene una hemorragia vaginal abundante.  Aparece una erupcin cutnea.  La materia fecal es negra, de aspecto alquitranado.  Se desmaya.  Vomita sangre.  Vomita repetidas veces.  Siente dolor abdominal.  Tiene fiebre o sntomas persistentes durante ms de 2 - 3 das.  Tiene fiebre y los sntomas empeoran repentinamente.  Se deshidrata.  ASEGRESE DE QUE:  Comprende estas instrucciones.  Controlar su afeccin.  Recibir ayuda de inmediato si no mejora o si empeora.  Esta informacin no tiene Theme park managercomo fin reemplazar el consejo del mdico. Asegrese de hacerle al mdico cualquier pregunta que tenga. Document Released: 10/07/2005 Document Revised: 06/09/2013 Document Reviewed: 04/02/2013 Elsevier Interactive Patient Education  2017 Elsevier Inc. Metrorragia funcional (Dysfunctional Uterine Bleeding) La metrorragia funcional es una hemorragia anormal proveniente del tero. La metrorragia funcional incluye estos sntomas:  Menstruacin que se adelanta o se atrasa.  Menstruacin menos o ms abundante, o con cogulos sanguneos.  Hemorragias entre los perodos Becton, Dickinson and Companymenstruales.  Ausencia de una o ms menstruaciones.  Hemorragias luego de Sales promotion account executivemantener relaciones sexuales.  Sangrado luego de la menopausia. INSTRUCCIONES PARA EL CUIDADO EN EL HOGAR Est atenta a cualquier cambio en los sntomas. Estas indicaciones pueden ayudarla con el trastorno: Comidas  Siga una dieta equilibrada. Incluya alimentos con alto contenido de hierro,  Air traffic controllercomo hgado,  carne, mariscos, verduras de Marriotthoja verde y Limestonehuevos.  Si tiene estreimiento: ? Liz ClaiborneBeba abundante agua. ? Consuma frutas y verduras con alto contenido de agua y Waynesvillefibra, Plentywoodcomo espinaca, zanahorias, frambuesas, manzanas y mango. Medicamentos  Baxter Internationalome los medicamentos de venta libre y los recetados solamente como se lo haya indicado el mdico.  No haga cambios en los medicamentos sin hablar con el mdico.  La aspirina o los medicamentos que la contienen pueden aumentar la hemorragia. No tome esos medicamentos: ? Durante la semana previa a Tax adviserla menstruacin. ? Durante la Brink's Companymenstruacin.  Si le recetaron comprimidos de hierro, Scientist, forensictmelos como se lo haya indicado el mdico. Estos ayudan a Restaurant manager, fast foodreponer el hierro que el organismo pierde debido a este trastorno. Actividad  Si debe cambiarse el apsito o el tampn ms de una vez cada 2horas: ? Acustese con los pies elevados. ? Colquese una compresa fra en la parte baja del abdomen. ? Haga todo el reposo que pueda hasta que la hemorragia se detenga o disminuya.  No trate de Management consultantadelgazar hasta que la hemorragia se detenga y los niveles de hierro en la sangre se normalicen. Otras indicaciones  MetLifeDurante dos meses, anote lo siguiente: ? La fecha de comienzo de Tax adviserla menstruacin. ? La fecha de su finalizacin. ? Los Rite Aidmomentos en los que tiene una hemorragia anormal. ? Los problemas que advierte.  Concurra a todas las visitas de control como se lo haya indicado el mdico. Esto es importante. SOLICITE ATENCIN MDICA SI:  Se siente dbil o que va a desvanecerse.  Tiene nuseas y vmitos.  No puede comer ni beber sin vomitar.  Tiene mareos o diarrea mientras toma los medicamentos.  Est tomando anticonceptivos u hormonas, y desea cambiar o suspender estos medicamentos. SOLICITE ATENCIN MDICA DE INMEDIATO SI:  Tiene escalofros o fiebre.  Debe cambiarse el apsito o el tampn ms de una vez por hora.  La hemorragia se vuelve ms abundante o el  flujo menstrual contiene cogulos con ms frecuencia.  Siente dolor en el abdomen.  Pierde la conciencia.  Le aparece una erupcin cutnea. Esta informacin no tiene Theme park managercomo fin reemplazar el consejo del mdico. Asegrese de hacerle al mdico cualquier pregunta que tenga. Document Released: 07/17/2005 Document Revised: 06/28/2015 Document Reviewed: 01/02/2015 Elsevier Interactive Patient Education  [employer]2018 Elsevier Inc.

## 2017-10-15 ENCOUNTER — Inpatient Hospital Stay (HOSPITAL_COMMUNITY)
Admission: AD | Admit: 2017-10-15 | Discharge: 2017-10-16 | Disposition: A | Discharge status: Home / Self Care | Payer: BLUE CROSS/BLUE SHIELD | Source: Ambulatory Visit | Attending: Obstetrics & Gynecology | Admitting: Obstetrics & Gynecology

## 2017-10-15 ENCOUNTER — Encounter (HOSPITAL_COMMUNITY): Payer: Self-pay

## 2017-10-15 DIAGNOSIS — J45909 Unspecified asthma, uncomplicated: Secondary | ICD-10-CM | POA: Diagnosis not present

## 2017-10-15 DIAGNOSIS — D649 Anemia, unspecified: Secondary | ICD-10-CM | POA: Insufficient documentation

## 2017-10-15 DIAGNOSIS — N938 Other specified abnormal uterine and vaginal bleeding: Secondary | ICD-10-CM

## 2017-10-15 DIAGNOSIS — Z79899 Other long term (current) drug therapy: Secondary | ICD-10-CM | POA: Insufficient documentation

## 2017-10-15 LAB — URINALYSIS, ROUTINE W REFLEX MICROSCOPIC
BILIRUBIN URINE: NEGATIVE
Bacteria, UA: NONE SEEN
GLUCOSE, UA: NEGATIVE mg/dL
KETONES UR: NEGATIVE mg/dL
LEUKOCYTES UA: NEGATIVE
Nitrite: NEGATIVE
PH: 7 (ref 5.0–8.0)
PROTEIN: NEGATIVE mg/dL
Specific Gravity, Urine: 1.021 (ref 1.005–1.030)

## 2017-10-15 LAB — POCT PREGNANCY, URINE: Preg Test, Ur: NEGATIVE

## 2017-10-15 NOTE — MAU Note (Signed)
Pt reports vaginal bleeding that started 11/29. States she had a BTL. Was seen prior and given medication to help stop the bleeding, states the bleeding is about the same. States she changed pad 4 times but the last couple of days she only changed her pad 2 times. Pt denies passing large clots or odor. Pt denies pain.

## 2017-10-16 DIAGNOSIS — N938 Other specified abnormal uterine and vaginal bleeding: Secondary | ICD-10-CM | POA: Diagnosis not present

## 2017-10-16 MED ORDER — MEGESTROL ACETATE 40 MG PO TABS: ORAL_TABLET | ORAL | 1 refills | Status: DC

## 2017-10-16 NOTE — MAU Provider Note (Signed)
Chief Complaint: Vaginal Bleeding   SUBJECTIVE HPI: Valerie [name] is a 31 y.o. [id number] who presents to MAU with continued vaginal bleeding. Patient states she was just seen in MAU about 2 weeks ago for vaginal bleeding. She was given medications but states she has continued to bleed. Bleeding now for one month. Bleeding is light and then gets heavy again. She has changed 4 pads int he last 24hrs. Denies large blood clots. States she was prescribed megace and took 40mg  TID for 4 days as instructed. Also was given iron for which she is taking. Has not followed up with Gynecology. Has h/o BTL. Denies any fevers, abdominal pain, n/v.    Past Medical History:  Diagnosis Date  . Asthma   . PONV (postoperative nausea and vomiting)    OB History  Gravida Para Term Preterm AB Living  5 4 3 1 1 3   SAB TAB Ectopic Multiple Live Births  1 0 0 0 3    # Outcome Date GA Lbr Len/2nd Weight Sex Delivery Anes PTL Lv  5 Term 08/29/16 3759w0d  3.19 kg (7 lb 0.5 oz) M CS-LTranv Spinal, EPI  LIV     Birth Comments: normal term female infant  4 Term 07/08/10 6571w0d  3.5 kg (7 lb 11.5 oz) M CS-LTranv  N LIV     Birth Comments: was on bed rest early in pregnancy due to placenta (location ?)  3 Term 08/25/07 3271w0d  3.5 kg (7 lb 11.5 oz) M CS-LTranv   LIV     Birth Comments: scheduled c/s due to umbilical cord wrapped around neck  2 SAB 03/19/06          1 Preterm 03/19/05 9285w0d       FD     Complications: Fetal growth restriction     Past Surgical History:  Procedure Laterality Date  . CESAREAN SECTION    . CESAREAN SECTION WITH BILATERAL TUBAL LIGATION Bilateral 08/29/2016   Procedure: CESAREAN SECTION WITH BILATERAL TUBAL LIGATION;  Surgeon: Adam PhenixJames G Arnold, MD;  [facility];  Service: Obstetrics;  Laterality: Bilateral;  . CHOLECYSTECTOMY N/A 06/23/2017   Procedure: LAPAROSCOPIC CHOLECYSTECTOMY;  Surgeon: Violeta Gelinashompson, Burke, MD;  [facility];  Service: General;  Laterality: N/A;  .  OVARIAN CYST REMOVAL     Social History   Socioeconomic History  . Marital status: Married    Spouse name: Not on file  . Number of children: Not on file  . Years of education: Not on file  . Highest education level: Not on file  Social Needs  . Financial resource strain: Not on file  . Food insecurity - worry: Not on file  . Food insecurity - inability: Not on file  . Transportation needs - medical: Not on file  . Transportation needs - non-medical: Not on file  Occupational History  . Not on file  Tobacco Use  . Smoking status: Never Smoker  . Smokeless tobacco: Never Used  Substance and Sexual Activity  . Alcohol use: No  . Drug use: No  . Sexual activity: Yes  Other Topics Concern  . Not on file  Social History Narrative  . Not on file   No current facility-administered medications on file prior to encounter.    Current Outpatient Medications on File Prior to Encounter  Medication Sig Dispense Refill  . albuterol (PROVENTIL) (2.5 MG/3ML) 0.083% nebulizer solution Take 2.5 mg by nebulization every 6 (six) hours as needed for wheezing or shortness of breath.    .Marland Kitchen  amoxicillin-clavulanate (AUGMENTIN) 875-125 MG tablet Take 1 tablet by mouth 2 (two) times daily. 14 tablet 0  . ondansetron (ZOFRAN ODT) 4 MG disintegrating tablet Take 1 tablet (4 mg total) by mouth every 8 (eight) hours as needed for nausea or vomiting. 20 tablet 0  . oxyCODONE-acetaminophen (PERCOCET) 10-325 MG tablet Take 1 tablet by mouth every 4 (four) hours as needed for pain. 15 tablet 0   No Known Allergies  I have reviewed the past Medical Hx, Surgical Hx, Social Hx, Allergies and Medications.   REVIEW OF SYSTEMS All systems reviewed and are negative for acute change except as noted in the HPI.   OBJECTIVE BP 122/61 (BP Location: Right Arm)   Pulse 75   Temp 98.3 F (36.8 C) (Oral)   Resp 16   Ht 5' 6.5" (1.689 m)   Wt 94.8 kg (209 lb)   LMP 09/18/2017   SpO2 100%   BMI 33.23 kg/m     PHYSICAL EXAM Constitutional: Well-developed, well-nourished female in no acute distress.  Cardiovascular: normal rate and rhythm, pulses intact Respiratory: normal rate and effort.  GI: Abd soft, non-tender, non-distended. Pos BS x 4 MS: Extremities nontender, no edema, normal ROM Neurologic: Alert and oriented x 4. No focal deficits Psych: normal mood and affect  LAB RESULTS Results for orders placed or performed during the hospital encounter of 10/15/17 (from the past 24 hour(s))  Urinalysis, Routine w reflex microscopic     Status: Abnormal   Collection Time: 10/15/17 11:09 PM  Result Value Ref Range   Color, Urine STRAW (A) YELLOW   APPearance CLEAR CLEAR   Specific Gravity, Urine 1.021 1.005 - 1.030   pH 7.0 5.0 - 8.0   Glucose, UA NEGATIVE NEGATIVE mg/dL   Hgb urine dipstick MODERATE (A) NEGATIVE   Bilirubin Urine NEGATIVE NEGATIVE   Ketones, ur NEGATIVE NEGATIVE mg/dL   Protein, ur NEGATIVE NEGATIVE mg/dL   Nitrite NEGATIVE NEGATIVE   Leukocytes, UA NEGATIVE NEGATIVE   RBC / HPF TOO NUMEROUS TO COUNT 0 - 5 RBC/hpf   WBC, UA 0-5 0 - 5 WBC/hpf   Bacteria, UA NONE SEEN NONE SEEN   Squamous Epithelial / LPF 0-5 (A) NONE SEEN   Mucus PRESENT   Pregnancy, urine POC     Status: None   Collection Time: 10/15/17 11:33 PM  Result Value Ref Range   Preg Test, Ur NEGATIVE NEGATIVE    IMAGING No results found.  MAU COURSE Vitals and nursing notes reviewed I have ordered labs and reviewed them Treatments given in MAU: None  MDM Plan of care reviewed with patient, including labs and tests ordered and medical treatment.   ASSESSMENT 1. DUB (dysfunctional uterine bleeding)     PLAN Discharge home in stable condition. Continue iron for anemia Megace instructions given for patient to complete total of 15 days to help with bleeding Message sent to clinic to schedule patient for visit for work-up of DUB; number also given to patient to call Counseled on return  precautions Handout given   Caryl AdaJazma Chrisopher Pustejovsky, DO OB Fellow Faculty Practice, [facility] 10/16/2017, 12:59 AM

## 2017-10-16 NOTE — Discharge Instructions (Signed)
Call and make appointment in clinic this week  Megace dosing: Take 40mg  tablet three times daily for 5 days, then twice daily for 5 days, then daily for 5 days.    Metrorragia funcional (Dysfunctional Uterine Bleeding) La metrorragia funcional es una hemorragia anormal proveniente del tero. La metrorragia funcional incluye estos sntomas:  Menstruacin que se adelanta o se atrasa.  Menstruacin menos o ms abundante, o con cogulos sanguneos.  Hemorragias entre los perodos Becton, Dickinson and Companymenstruales.  Ausencia de una o ms menstruaciones.  Hemorragias luego de Sales promotion account executivemantener relaciones sexuales.  Sangrado luego de la menopausia. INSTRUCCIONES PARA EL CUIDADO EN EL HOGAR Est atenta a cualquier cambio en los sntomas. Estas indicaciones pueden ayudarla con el trastorno: Comidas  Siga una dieta equilibrada. Incluya alimentos con FedExalto contenido de hierro, como hgado, carne, Oceanographermariscos, verduras de hoja verde y Kalifornskyhuevos.  Si tiene estreimiento: ? Liz ClaiborneBeba abundante agua. ? Consuma frutas y verduras con alto contenido de agua y Sylvesterfibra, Cranecomo espinaca, zanahorias, frambuesas, manzanas y mango. Medicamentos  Baxter Internationalome los medicamentos de venta libre y los recetados solamente como se lo haya indicado el mdico.  No haga cambios en los medicamentos sin hablar con el mdico.  La aspirina o los medicamentos que la contienen pueden aumentar la hemorragia. No tome esos medicamentos: ? Durante la semana previa a Tax adviserla menstruacin. ? Durante la Brink's Companymenstruacin.  Si le recetaron comprimidos de hierro, Scientist, forensictmelos como se lo haya indicado el mdico. Estos ayudan a Restaurant manager, fast foodreponer el hierro que el organismo pierde debido a este trastorno. Actividad  Si debe cambiarse el apsito o el tampn ms de una vez cada 2horas: ? Acustese con los pies elevados. ? Colquese una compresa fra en la parte baja del abdomen. ? Haga todo el reposo que pueda hasta que la hemorragia se detenga o disminuya.  No trate de Management consultantadelgazar hasta que la  hemorragia se detenga y los niveles de hierro en la sangre se normalicen. Otras indicaciones  MetLifeDurante dos meses, anote lo siguiente: ? La fecha de comienzo de Tax adviserla menstruacin. ? La fecha de su finalizacin. ? Los Rite Aidmomentos en los que tiene una hemorragia anormal. ? Los problemas que advierte.  Concurra a todas las visitas de control como se lo haya indicado el mdico. Esto es importante. SOLICITE ATENCIN MDICA SI:  Se siente dbil o que va a desvanecerse.  Tiene nuseas y vmitos.  No puede comer ni beber sin vomitar.  Tiene mareos o diarrea mientras toma los medicamentos.  Est tomando anticonceptivos u hormonas, y desea cambiar o suspender estos medicamentos. SOLICITE ATENCIN MDICA DE INMEDIATO SI:  Tiene escalofros o fiebre.  Debe cambiarse el apsito o el tampn ms de una vez por hora.  La hemorragia se vuelve ms abundante o el flujo menstrual contiene cogulos con ms frecuencia.  Siente dolor en el abdomen.  Pierde la conciencia.  Le aparece una erupcin cutnea. Esta informacin no tiene Theme park managercomo fin reemplazar el consejo del mdico. Asegrese de hacerle al mdico cualquier pregunta que tenga. Document Released: 07/17/2005 Document Revised: 06/28/2015 Document Reviewed: 01/02/2015 Elsevier Interactive Patient Education  [employer]2018 Elsevier Inc.

## 2017-11-17 ENCOUNTER — Ambulatory Visit: Payer: BLUE CROSS/BLUE SHIELD | Admitting: Obstetrics & Gynecology

## 2017-11-17 ENCOUNTER — Encounter: Payer: Self-pay | Admitting: Obstetrics & Gynecology

## 2017-11-17 VITALS — BP 96/59 | HR 61 | Wt 208.2 lb

## 2017-11-17 DIAGNOSIS — N938 Other specified abnormal uterine and vaginal bleeding: Secondary | ICD-10-CM | POA: Diagnosis not present

## 2017-11-17 DIAGNOSIS — D500 Iron deficiency anemia secondary to blood loss (chronic): Secondary | ICD-10-CM | POA: Insufficient documentation

## 2017-11-17 MED ORDER — MEGESTROL ACETATE 40 MG PO TABS: ORAL_TABLET | ORAL | 2 refills | Status: DC

## 2017-11-17 NOTE — Patient Instructions (Signed)
Sangrado uterino anormal °(Abnormal Uterine Bleeding) °Sangrado uterino anormal significa que hay un sangrado por la vagina que no es su período menstrual normal. Puede ser: °· Pérdidas de sangre o hemorragias entre los períodos. °· Hemorragias luego de tener sexo (relaciones sexuales). °· Sangrado abundante o más que lo habitual. °· Períodos que duran más que lo normal. °· Sangrado luego de la menopausia. °Hay muchos problemas que pueden ser la causa. El tratamiento dependerá de la causa del sangrado. Cualquier tipo de sangrado que no sea normal debe consultarse con el médico. °CUIDADOS EN EL HOGAR °Controle su afección para ver si hay cambios. Estas indicaciones podrán disminuir cualquier molestia que tenga: °· No use tampones ni duchas vaginales o como le haya indicado el médico. °· Cambie los apósitos con frecuencia. °Deberá hacerse exámenes pélvicos regulares y pruebas de Papanicolaou. Realice los estudios indicados según le indique su médico. °SOLICITE AYUDA SI: °· El sangrado dura más de 1 semana. °· Se siente mareada por momentos. ° °SOLICITE AYUDA DE INMEDIATO SI: °· Se desmaya. °· Tiene que cambiarse los apósitos cada 15 a 30 minutos. °· Siente dolor en el abdomen. °· Tiene fiebre. °· Se siente débil o presenta sudoración. °· Elimina coágulos grandes por la vagina. °· Siente malestar estomacal (náuseas) y devuelve (vomita). ° °ASEGÚRESE DE QUE: °· Comprende estas instrucciones. °· Controlará su afección. °· Recibirá ayuda de inmediato si no mejora o si empeora. ° °Esta información no tiene como fin reemplazar el consejo del médico. Asegúrese de hacerle al médico cualquier pregunta que tenga. °Document Released: 11/09/2010 Document Revised: 10/12/2013 Document Reviewed: 05/06/2013 °Elsevier Interactive Patient Education © 2017 Elsevier Inc. ° °

## 2017-11-17 NOTE — Progress Notes (Signed)
Patient ID: Valerie [name], female   DOB: 1986/10/19, 32 y.o.   MRN: [id number]  Chief Complaint  Patient presents with  . Vaginal Bleeding    HPI Valerie [name] is a 32 y.o. female.  Patient's last menstrual period was 11/07/2017 (exact date). [id number] Since November patient has had episodes of prolonged vaginal bleeding which only stopped after she was given a Megace taper in December. S/P BTL 2017.  HPI  Past Medical History:  Diagnosis Date  . Asthma   . PONV (postoperative nausea and vomiting)     Past Surgical History:  Procedure Laterality Date  . CESAREAN SECTION    . CESAREAN SECTION WITH BILATERAL TUBAL LIGATION Bilateral 08/29/2016   Procedure: CESAREAN SECTION WITH BILATERAL TUBAL LIGATION;  Surgeon: Adam PhenixJames G Arnold, MD;  [facility];  Service: Obstetrics;  Laterality: Bilateral;  . CHOLECYSTECTOMY N/A 06/23/2017   Procedure: LAPAROSCOPIC CHOLECYSTECTOMY;  Surgeon: Violeta Gelinashompson, Burke, MD;  [facility];  Service: General;  Laterality: N/A;  . OVARIAN CYST REMOVAL      No family history on file.  Social History Social History   Tobacco Use  . Smoking status: Never Smoker  . Smokeless tobacco: Never Used  Substance Use Topics  . Alcohol use: No  . Drug use: No    No Known Allergies  Current Outpatient Medications  Medication Sig Dispense Refill  . albuterol (PROVENTIL) (2.5 MG/3ML) 0.083% nebulizer solution Take 2.5 mg by nebulization every 6 (six) hours as needed for wheezing or shortness of breath.    . megestrol (MEGACE) 40 MG tablet One by mouth daily 30 tablet 2   No current facility-administered medications for this visit.     Review of Systems Review of Systems  Constitutional: Negative.   Respiratory: Negative.   Gastrointestinal: Negative.   Genitourinary: Positive for menstrual problem, pelvic pain (cramps) and vaginal bleeding.    Blood pressure (!) 96/59, pulse 61, weight 208 lb 3.2 oz (94.4 kg), last menstrual period  11/07/2017, unknown if currently breastfeeding.  Physical Exam Physical Exam  Constitutional: She is oriented to person, place, and time. She appears well-developed. No distress.  Pulmonary/Chest: Effort normal.  Neurological: She is alert and oriented to person, place, and time.  Skin: No pallor.  Psychiatric: She has a normal mood and affect. Her behavior is normal.  Vitals reviewed.   Data Reviewed CBC    Component Value Date/Time   WBC 9.5 09/27/2017 1735   RBC 3.83 (L) 09/27/2017 1735   HGB 8.4 (L) 09/27/2017 1735   HCT 28.1 (L) 09/27/2017 1735   PLT 416 (H) 09/27/2017 1735   MCV 73.4 (L) 09/27/2017 1735   MCH 21.9 (L) 09/27/2017 1735   MCHC 29.9 (L) 09/27/2017 1735   RDW 16.7 (H) 09/27/2017 1735   LYMPHSABS 2.2 09/27/2017 1735   MONOABS 0.2 09/27/2017 1735   EOSABS 0.8 (H) 09/27/2017 1735   BASOSABS 0.0 09/27/2017 1735     Assessment    Menometrorrhagia  Anemia    Plan    Megace 40 mg dialy Pelvic US RTC for f/u. Consider OCP, Mirena, ablation       Scheryl DarterJames Arnold 11/17/2017, 11:19 AM

## 2017-11-17 NOTE — Progress Notes (Signed)
Spanish interpreter [phone] used.

## 2017-11-18 LAB — CBC
Hematocrit: 27.2 % — ABNORMAL LOW (ref 34.0–46.6)
Hemoglobin: 8.1 g/dL — ABNORMAL LOW (ref 11.1–15.9)
MCH: 20.7 pg — ABNORMAL LOW (ref 26.6–33.0)
MCHC: 29.8 g/dL — ABNORMAL LOW (ref 31.5–35.7)
MCV: 70 fL — AB (ref 79–97)
PLATELETS: 432 10*3/uL — AB (ref 150–379)
RBC: 3.91 x10E6/uL (ref 3.77–5.28)
RDW: 16 % — AB (ref 12.3–15.4)
WBC: 5.3 10*3/uL (ref 3.4–10.8)

## 2017-11-19 ENCOUNTER — Ambulatory Visit (HOSPITAL_COMMUNITY)
Admission: RE | Admit: 2017-11-19 | Discharge: 2017-11-19 | Disposition: A | Discharge status: Home / Self Care | Payer: BLUE CROSS/BLUE SHIELD | Source: Ambulatory Visit | Attending: Obstetrics & Gynecology | Admitting: Obstetrics & Gynecology

## 2017-11-19 DIAGNOSIS — N938 Other specified abnormal uterine and vaginal bleeding: Secondary | ICD-10-CM | POA: Insufficient documentation

## 2017-12-16 ENCOUNTER — Ambulatory Visit: Payer: BLUE CROSS/BLUE SHIELD | Admitting: Obstetrics & Gynecology

## 2017-12-24 ENCOUNTER — Encounter: Payer: Self-pay | Admitting: Obstetrics & Gynecology

## 2017-12-24 ENCOUNTER — Ambulatory Visit: Payer: BLUE CROSS/BLUE SHIELD | Admitting: Obstetrics & Gynecology

## 2017-12-24 VITALS — BP 110/64 | HR 66 | Ht 66.0 in | Wt 205.6 lb

## 2017-12-24 DIAGNOSIS — N938 Other specified abnormal uterine and vaginal bleeding: Secondary | ICD-10-CM | POA: Diagnosis not present

## 2017-12-24 DIAGNOSIS — Z30430 Encounter for insertion of intrauterine contraceptive device: Secondary | ICD-10-CM | POA: Diagnosis not present

## 2017-12-24 LAB — POCT PREGNANCY, URINE: PREG TEST UR: NEGATIVE

## 2017-12-24 MED ORDER — LEVONORGESTREL 19.5 MCG/DAY IU IUD
INTRAUTERINE_SYSTEM | Freq: Once | INTRAUTERINE | Status: AC
  Administered 2017-12-24: 1 via INTRAUTERINE

## 2017-12-24 NOTE — Progress Notes (Signed)
[employer] interpreter [place] [phone]

## 2017-12-24 NOTE — Progress Notes (Signed)
Patient ID: Valerie [name], female   DOB: 1986/08/16, 32 y.o.   MRN: [id number]    GYNECOLOGY OFFICE PROCEDURE NOTE  Valerie [name] is a 32 y.o. [id number] here for Liletta IUD insertion. Only light bleeding using Megace but feels the medicine makes her nervous.  Last pap smear was on 2016 and was normal. We discussed OCP and ablation for her heavy periods and she requests Liletta IUD Insertion Procedure Note Patient identified, informed consent performed, consent signed.   Discussed risks of irregular bleeding, cramping, infection, malpositioning or misplacement of the IUD outside the uterus which may require further procedure such as laparoscopy. Time out was performed.  Urine pregnancy test negative  Speculum placed in the vagina.  Cervix visualized.  Cleaned with Betadine x 2. Benzocaine spray applied  Grasped anteriorly with a single tooth tenaculum.  Uterus sounded to 9 cm.  Liletta IUD placed per manufacturer's recommendations.  Strings trimmed to 3 cm. Tenaculum was removed, good hemostasis noted.  Patient tolerated procedure well.   Patient was given post-procedure instructions.  She was advised to have backup contraception for one week.  Patient was also asked to check IUD strings periodically and follow up in 4 weeks for IUD check.   Scheryl DarterJAMES ARNOLD, MD, FACOG Attending Obstetrician & Gynecologist, [facility], [facility]

## 2017-12-24 NOTE — Patient Instructions (Signed)
Colocacin de un dispositivo intrauterino - Cuidados posteriores (Intrauterine Device Insertion, Care After) Siga estas instrucciones durante las prximas semanas. Estas indicaciones le proporcionan informacin general acerca de cmo deber cuidarse despus del procedimiento. El mdico tambin podr darle instrucciones ms especficas. El tratamiento ha sido planificado segn las prcticas mdicas actuales, pero en algunos casos pueden ocurrir problemas. Comunquese con el mdico si tiene algn problema o tiene dudas despus del procedimiento. QU ESPERAR DESPUS DEL PROCEDIMIENTO La insercin del DIU puede causar molestias, como clicos. que deberan mejorar una vez que el DIU est en su lugar. Podr tener sangrado despus del procedimiento. Esto es normal. Vara desde un sangrado ligero durante un par de Countrywide Financialdas hasta un sangrado similar al menstrual. Cuando el DIU est en su lugar, se extender un hilo de 1 a 2pulgadas (2,5 a 5cm) por el cuello del tero en la vagina. El hilo no debera molestarle a usted ni a su pareja. De lo contrario, consulte con su mdico. INSTRUCCIONES PARA EL CUIDADO EN EL HOGAR  Controle su DIU para asegurarse de que est en su lugar, antes de reanudar la actividad sexual. Tiene que sentir los hilos. Si no los siente, algo puede estar mal. El DIU puede haberse salido del tero o ste puede haber sido atravesado (perforado) durante la colocacin. Adems, si los hilos son ms largos, puede significar que el DIU se est saliendo del tero. Si ocurre alguno de Limited Brandsestos problemas, no estar protegida y podr Scientist, research (physical sciences)quedar embarazada.  Puede volver a Management consultanttener relaciones sexuales si no tiene problemas con el DIU. El DIU de cobre se considera efectivo y funciona de inmediato, si se inserta dentro de los 7 [address]  [address]das del inicio del perodo. Ser necesario que utilice un mtodo anticonceptivo adicional durante 7 Carolinadas, si el DIU se inserta en algn otro momento del ciclo.  Controle que el DIU sigue en su  lugar sintiendo los hilos despus de cada perodo menstrual.  Es posible que necesite tomar analgsicos, como acetaminofeno o ibuprofeno. Tome todos los medicamentos como le indic el mdico.  SOLICITE ATENCIN MDICA SI:  Tiene un sangrado ms abundante o dura ms de un ciclo menstrual normal.  Tiene fiebre.  Siente clicos o dolor abdominal que no se alivian con medicamentos.  Siente dolor abdominal que no parece estar relacionado con el rea en que senta los clicos y Chief Technology Officerel dolor anteriormente.  Se siente mareada, inusualmente dbil o se desmaya.  Tiene flujo vaginal u olores anormales.  Siente dolor durante las The St. Paul Travelersrelaciones sexuales.  No puede sentir los hilos del DIU o los siente ms largos.  Siente que el DIU est en la abertura del cuello del tero, en la vagina.  Piensa que est embarazada o no tiene su perodo menstrual.  El hilo del DIU est lastimando a su pareja sexual.  ASEGRESE DE QUE:  Comprende estas instrucciones.  Controlar su afeccin.  Recibir ayuda de inmediato si no mejora o si empeora.  Esta informacin no tiene Theme park managercomo fin reemplazar el consejo del mdico. Asegrese de hacerle al mdico cualquier pregunta que tenga. Document Released: 07/01/2012 Document Revised: 07/28/2013 Document Reviewed: 03/28/2013 Elsevier Interactive Patient Education  2017 [employer]vier [employer]. Levonorgestrel intrauterine device (IUD) Qu es este medicamento? El DIU CON LEVONORGESTREL es un dispositivo anticonceptivo (control de la natalidad). Un profesional de Engineer, drillingla salud coloca el dispositivo dentro el tero. Se [place] para evitar los embarazos. Este dispositivo tambin puede usarse para tratar el sangrado intenso que se produce durante el perodo menstrual. Este medicamento puede ser Hilltoputilizado para  otros usos; si tiene alguna pregunta consulte con su proveedor de atencin mdica o con su farmacutico. MARCAS COMUNES: Domingo Mend, Mirena, Skyla Qu le debo informar a mi profesional  de la salud antes de tomar este medicamento? Necesitan saber si usted presenta alguno de los [employer] o situaciones: examen de Papanicolaou anormal cncer de mama, tero o crvix diabetes endometritis infeccin genital o plvica actual o en el pasado tener ms de una pareja sexual o si su pareja tiene ms de una pareja enfermedad cardiaca antecedentes de Psychiatrist ectpico o tubrico problemas del sistema inmunolgico DIU colocado en su lugar enfermedad o tumor heptico problemas con cogulos sanguneos o tomar anticoagulantes convulsiones uso de drogas intravenosas tero con forma inusual sangrado vaginal que no ha sido explicado una reaccin alrgica o inusual al levonorgestrel, a otras hormonas, a la silicona, o al polietileno, a otros medicamentos, alimentos, colorantes o conservantes si est embarazada o buscando quedar embarazada si est amamantando a un beb Cmo debo [employer]? Un profesional de Naval architect este dispositivo en el tero. Hable con su pediatra para informarse acerca del uso de este medicamento en nios. Puede requerir atencin especial. Sobredosis: Pngase en contacto inmediatamente con un centro toxicolgico o una sala de urgencia si usted cree que haya tomado demasiado medicamento. ATENCIN: [employer] es solo para usted. No comparta este medicamento con nadie. Qu sucede si me olvido de una dosis? No se aplica en este caso. Segn la marca del dispositivo que tenga insertado, el dispositivo deber reemplazarse cada 3 a 5 aos si desea continuar usando este tipo de mtodo anticonceptivo. Qu puede interactuar con este medicamento? No tome este medicamento con ninguno de los siguientes frmacos: amprenavir bosentano fosamprenavir Este medicamento tambin puede interactuar con los siguientes medicamentos: aprepitant armodafinilo barbitricos para inducir el sueo o para el tratamiento de convulsiones bexaroteno boceprevir griseofulvina  medicamentos para tratar convulsiones, tales como carbamazepina, etotona, [place], [place], [place], topiramato modafinilo pioglitazona rifabutina rifampicina rifapentina algunos medicamentos para tratar la infeccin por el VIH, tales como atazanavir, efavirenz, indinavir, lopinavir, nelfinavir, tipranavir, ritonavir hierba de [place] warfarina Puede ser que esta lista no menciona todas las posibles interacciones. Informe a su profesional de [employer] de [employer] productos a base de hierbas, medicamentos de [place] o suplementos nutritivos que est tomando. Si usted fuma, consume bebidas alcohlicas o si utiliza drogas ilegales, indqueselo tambin a su profesional de [employer]. Algunas sustancias pueden interactuar con su medicamento. A qu debo estar atento al usar [employer]? Visite a su mdico o a su profesional de la salud para revisar su evolucin peridicamente. Consulte a su mdico si usted o su pareja tiene contacto sexual con Economist, descubre que tiene VIH positivo, o contrae una enfermedad de transmisin sexual. Cleda Clarks producto no la protege de la infeccin por VIH (SIDA) ni de ninguna otra enfermedad de transmisin sexual. Puede verificar la colocacin del DIU usted misma colocando los dedos limpios en la parte superior de la vagina para tocar los hilos. No tire de los hilos. Es un buen hbito verificar la colocacin del DIU despus de cada perodo menstrual. Llame a su mdico de inmediato si siente ms del DIU que solo los hilos o si no puede sentir los hilos. El DIU podra salirse solo. Es posible que quede embarazada si el dispositivo se sale. Si nota que se ha [employer] use un mtodo anticonceptivo de respaldo, [place] condones, y llame a su proveedor de Psychologist, prison and probation services. Usar tampones no  cambiar la posicin del DIU y puede usarlos sin problema durante sus perodos menstruales. Este DIU puede escanearse en forma segura en una resonancia magntica (MRI) solo bajo  condiciones especficas. Antes de realizarse [employer], informe a su proveedor de atencin mdica que tiene colocado un DIU, y el tipo de DIU que tiene. Qu efectos secundarios puedo tener al [employer] este medicamento? Efectos secundarios que debe informar a su mdico o a Producer, television/film/video de la salud tan pronto como sea posible: -Therapist, art como erupcin cutnea, picazn o urticarias, hinchazn de la cara, labios o lengua -fiebre, sntomas gripales -llagas genitales -alta presin sangunea -ausencia de un perodo menstrual durante 6 semanas mientras lo utiliza -Engineer, mining, Public librarian en las piernas -dolor o sensibilidad del plvico -dolor de cabeza repentino o severo -signos de Psychiatrist -calambres estomacales -falta de aliento repentina -problemas de coordinacin, del habla, al caminar -sangrado, flujo vaginal inusual -color amarillento de los ojos o la piel Efectos secundarios que, por lo general, no requieren atencin mdica (debe informarlos a su mdico o a su profesional de la salud si persisten o si son molestos): -acn -dolor de pecho -cambios en el deseo sexual o capacidad -cambios de peso -calambres, Research scientist (life sciences) o sensacin de [employer] se introduce el dispositivo -dolor de cabeza -sangrado menstruales irregulares en los primeros 3 a 6 meses de usar -nuseas Puede ser que esta lista no menciona todos los posibles efectos secundarios. Comunquese a su mdico por asesoramiento mdico [employer]ard. Usted puede informar los efectos secundarios a la FDA por telfono al 1-800-FDA-1088. Dnde debo guardar mi medicina? No se aplica en este caso. ATENCIN: Este folleto es un resumen. Puede ser que no cubra toda la posible informacin. Si usted tiene preguntas acerca de esta medicina, consulte con su mdico, su farmacutico o su profesional de Radiographer, therapeutic.  2018 Elsevier/Gold Standard (2016-11-07 00:00:00)

## 2017-12-26 ENCOUNTER — Encounter: Payer: Self-pay | Admitting: *Deleted

## 2018-01-22 ENCOUNTER — Encounter: Payer: Self-pay | Admitting: Obstetrics & Gynecology

## 2018-01-22 ENCOUNTER — Ambulatory Visit (INDEPENDENT_AMBULATORY_CARE_PROVIDER_SITE_OTHER): Payer: BLUE CROSS/BLUE SHIELD | Admitting: Obstetrics & Gynecology

## 2018-01-22 VITALS — BP 111/56 | HR 68 | Ht 66.0 in | Wt 209.1 lb

## 2018-01-22 DIAGNOSIS — N898 Other specified noninflammatory disorders of vagina: Secondary | ICD-10-CM

## 2018-01-22 DIAGNOSIS — Z113 Encounter for screening for infections with a predominantly sexual mode of transmission: Secondary | ICD-10-CM | POA: Diagnosis not present

## 2018-01-22 MED ORDER — FLUCONAZOLE 150 MG PO TABS: 150.0000 mg | ORAL_TABLET | Freq: Once | ORAL | 0 refills | Status: AC

## 2018-01-22 NOTE — Patient Instructions (Signed)
Levonorgestrel intrauterine device (IUD) Qu es este medicamento? El DIU CON LEVONORGESTREL es un dispositivo anticonceptivo (control de la natalidad). Un profesional de la salud coloca el dispositivo dentro el tero. Se usa para evitar los embarazos. Este dispositivo tambin puede usarse para tratar el sangrado intenso que se produce durante el perodo menstrual. Este medicamento puede ser utilizado para otros usos; si tiene alguna pregunta consulte con su proveedor de atencin mdica o con su farmacutico. MARCAS COMUNES: [place], LILETTA, Mirena, Skyla Qu le debo informar a mi profesional de la salud antes de tomar este medicamento? Necesitan saber si usted presenta alguno de los siguientes problemas o situaciones: examen de Papanicolaou anormal cncer de mama, tero o crvix diabetes endometritis infeccin genital o plvica actual o en el pasado tener ms de una pareja sexual o si su pareja tiene ms de una pareja enfermedad cardiaca antecedentes de embarazo ectpico o tubrico problemas del sistema inmunolgico DIU colocado en su lugar enfermedad o tumor heptico problemas con cogulos sanguneos o tomar anticoagulantes convulsiones uso de drogas intravenosas tero con forma inusual sangrado vaginal que no ha sido explicado una reaccin alrgica o inusual al levonorgestrel, a otras hormonas, a la silicona, o al polietileno, a otros medicamentos, alimentos, colorantes o conservantes si est embarazada o buscando quedar embarazada si est amamantando a un beb Cmo debo utilizar este medicamento? Un profesional de la salud coloca este dispositivo en el tero. Hable con su pediatra para informarse acerca del uso de este medicamento en nios. Puede requerir atencin especial. Sobredosis: Pngase en contacto inmediatamente con un centro toxicolgico o una sala de urgencia si usted cree que haya tomado demasiado medicamento. ATENCIN: Este medicamento es solo para usted. No comparta este medicamento  con nadie. Qu sucede si me olvido de una dosis? No se aplica en este caso. Segn la marca del dispositivo que tenga insertado, el dispositivo deber reemplazarse cada 3 a 5 aos si desea continuar usando este tipo de mtodo anticonceptivo. Qu puede interactuar con este medicamento? No tome este medicamento con ninguno de los siguientes frmacos: amprenavir bosentano fosamprenavir Este medicamento tambin puede interactuar con los siguientes medicamentos: aprepitant armodafinilo barbitricos para inducir el sueo o para el tratamiento de convulsiones bexaroteno boceprevir griseofulvina medicamentos para tratar convulsiones, tales como carbamazepina, etotona, felbamato, oxcarbazepina, fenitona, topiramato modafinilo pioglitazona rifabutina rifampicina rifapentina algunos medicamentos para tratar la infeccin por el VIH, tales como atazanavir, efavirenz, indinavir, lopinavir, nelfinavir, tipranavir, ritonavir hierba de [place] warfarina Puede ser que esta lista no menciona todas las posibles interacciones. Informe a su profesional de la salud de todos los productos a base de hierbas, medicamentos de venta libre o suplementos nutritivos que est tomando. Si usted fuma, consume bebidas alcohlicas o si utiliza drogas ilegales, indqueselo tambin a su profesional de la salud. Algunas sustancias pueden interactuar con su medicamento. A qu debo estar atento al usar este medicamento? Visite a su mdico o a su profesional de la salud para revisar su evolucin peridicamente. Consulte a su mdico si usted o su pareja tiene contacto sexual con otras personas, descubre que tiene VIH positivo, o contrae una enfermedad de transmisin sexual. Este producto no la protege de la infeccin por VIH (SIDA) ni de ninguna otra enfermedad de transmisin sexual. Puede verificar la colocacin del DIU usted misma colocando los dedos limpios en la parte superior de la vagina para tocar los hilos. No tire de los hilos. Es  un buen hbito verificar la colocacin del DIU despus de cada perodo menstrual. Llame a su mdico   de inmediato si siente ms del DIU que solo los hilos o si no puede sentir los hilos. El DIU podra salirse solo. Es posible que quede embarazada si el dispositivo se sale. Si nota que se ha salido el DIU use un mtodo anticonceptivo de respaldo, como condones, y llame a su proveedor de atencin mdica. Usar tampones no cambiar la posicin del DIU y puede usarlos sin problema durante sus perodos menstruales. Este DIU puede escanearse en forma segura en una resonancia magntica (MRI) solo bajo condiciones especficas. Antes de realizarse una resonancia magntica, informe a su proveedor de atencin mdica que tiene colocado un DIU, y el tipo de DIU que tiene. Qu efectos secundarios puedo tener al utilizar este medicamento? Efectos secundarios que debe informar a su mdico o a su profesional de la salud tan pronto como sea posible: -reacciones alrgicas como erupcin cutnea, picazn o urticarias, hinchazn de la cara, labios o lengua -fiebre, sntomas gripales -llagas genitales -alta presin sangunea -ausencia de un perodo menstrual durante 6 semanas mientras lo utiliza -dolor, hinchazn o calor en las piernas -dolor o sensibilidad del plvico -dolor de cabeza repentino o severo -signos de embarazo -calambres estomacales -falta de aliento repentina -problemas de coordinacin, del habla, al caminar -sangrado, flujo vaginal inusual -color amarillento de los ojos o la piel Efectos secundarios que, por lo general, no requieren atencin mdica (debe informarlos a su mdico o a su profesional de la salud si persisten o si son molestos): -acn -dolor de pecho -cambios en el deseo sexual o capacidad -cambios de peso -calambres, mareos o sensacin de desmayo mientras se introduce el dispositivo -dolor de cabeza -sangrado menstruales irregulares en los primeros 3 a 6 meses de usar -nuseas Puede  ser que esta lista no menciona todos los posibles efectos secundarios. Comunquese a su mdico por asesoramiento mdico sobre los efectos secundarios. Usted puede informar los efectos secundarios a la FDA por telfono al 1-800-FDA-1088. Dnde debo guardar mi medicina? No se aplica en este caso. ATENCIN: Este folleto es un resumen. Puede ser que no cubra toda la posible informacin. Si usted tiene preguntas acerca de esta medicina, consulte con su mdico, su farmacutico o su profesional de la salud.  2018 Elsevier/Gold Standard (2016-11-07 00:00:00)  

## 2018-01-22 NOTE — Progress Notes (Signed)
Patient ID: Valerie [name], female   DOB: January 07, 1986, 32 y.o.   MRN: [id number]  Chief Complaint  Patient presents with  . Gynecologic Exam    HPI Valerie [name] is a 32 y.o. female.  [id number] No LMP recorded. (Menstrual status: IUD). She returns for an string check and notes vaginal irritation and discharge. No vaginal bleeding HPI  Past Medical History:  Diagnosis Date  . Asthma   . PONV (postoperative nausea and vomiting)     Past Surgical History:  Procedure Laterality Date  . CESAREAN SECTION    . CESAREAN SECTION WITH BILATERAL TUBAL LIGATION Bilateral 08/29/2016   Procedure: CESAREAN SECTION WITH BILATERAL TUBAL LIGATION;  Surgeon: Adam PhenixJames G Carlis Blanchard, MD;  [facility];  Service: Obstetrics;  Laterality: Bilateral;  . CHOLECYSTECTOMY N/A 06/23/2017   Procedure: LAPAROSCOPIC CHOLECYSTECTOMY;  Surgeon: Violeta Gelinashompson, Burke, MD;  [facility];  Service: General;  Laterality: N/A;  . OVARIAN CYST REMOVAL      No family history on file.  Social History Social History   Tobacco Use  . Smoking status: Never Smoker  . Smokeless tobacco: Never Used  Substance Use Topics  . Alcohol use: No  . Drug use: No    No Known Allergies  Current Outpatient Medications  Medication Sig Dispense Refill  . albuterol (PROVENTIL) (2.5 MG/3ML) 0.083% nebulizer solution Take 2.5 mg by nebulization every 6 (six) hours as needed for wheezing or shortness of breath.     No current facility-administered medications for this visit.     Review of Systems Review of Systems  Constitutional: Negative.   Gastrointestinal: Negative.   Genitourinary: Positive for vaginal discharge. Negative for pelvic pain and vaginal bleeding.    Blood pressure (!) 111/56, pulse 68, height 5\' 6"  (1.676 m), weight 209 lb 1.6 oz (94.8 kg), unknown if currently breastfeeding.  Physical Exam Physical Exam  Constitutional: She is oriented to person, place, and time. She appears well-developed. No  distress.  Cardiovascular: Normal rate.  Pulmonary/Chest: Effort normal.  Genitourinary: Vaginal discharge (white c/w yeast) found.  Genitourinary Comments: String 3 cm at the cervical os  Neurological: She is alert and oriented to person, place, and time.  Psychiatric: She has a normal mood and affect. Her behavior is normal.  Vitals reviewed.   Data Reviewed Office notes  Assessment    Normal IUD placement Suspect yeast vaginal infection    Plan    Diflucan for yeast Routine gyn care       Scheryl DarterJames Kjuan Seipp 01/22/2018, 4:21 PM

## 2018-01-23 LAB — CERVICOVAGINAL ANCILLARY ONLY
Bacterial vaginitis: NEGATIVE
CANDIDA VAGINITIS: POSITIVE — AB
Chlamydia: NEGATIVE
NEISSERIA GONORRHEA: NEGATIVE
TRICH (WINDOWPATH): NEGATIVE

## 2018-02-17 ENCOUNTER — Other Ambulatory Visit: Payer: Self-pay | Admitting: Obstetrics & Gynecology

## 2018-02-17 ENCOUNTER — Telehealth: Payer: Self-pay

## 2018-02-17 DIAGNOSIS — B3731 Acute candidiasis of vulva and vagina: Secondary | ICD-10-CM

## 2018-02-17 DIAGNOSIS — B373 Candidiasis of vulva and vagina: Secondary | ICD-10-CM

## 2018-02-17 MED ORDER — FLUCONAZOLE 150 MG PO TABS: 150.0000 mg | ORAL_TABLET | Freq: Once | ORAL | 0 refills | Status: AC

## 2018-02-17 NOTE — Telephone Encounter (Signed)
Called Pt to advise that Dr. Rhina Brackett Diflucan to her provider for Yeast Infection. No answer, so left VM to call back.

## 2018-02-17 NOTE — Telephone Encounter (Signed)
-----   Message from Adam Phenix, MD sent at 02/17/2018  8:45 AM EDT ----- Diflucan Rx sent

## 2018-09-07 ENCOUNTER — Encounter (HOSPITAL_COMMUNITY): Payer: Self-pay | Admitting: Emergency Medicine

## 2018-09-07 ENCOUNTER — Ambulatory Visit (HOSPITAL_COMMUNITY)
Admission: EM | Admit: 2018-09-07 | Discharge: 2018-09-07 | Disposition: A | Discharge status: Home / Self Care | Payer: BLUE CROSS/BLUE SHIELD | Attending: Family Medicine | Admitting: Family Medicine

## 2018-09-07 DIAGNOSIS — J4520 Mild intermittent asthma, uncomplicated: Secondary | ICD-10-CM

## 2018-09-07 DIAGNOSIS — Z760 Encounter for issue of repeat prescription: Secondary | ICD-10-CM

## 2018-09-07 MED ORDER — CETIRIZINE HCL 10 MG PO TABS: 10.0000 mg | ORAL_TABLET | Freq: Every day | ORAL | 0 refills | Status: DC

## 2018-09-07 MED ORDER — ALBUTEROL SULFATE HFA 108 (90 BASE) MCG/ACT IN AERS: 1.0000 | INHALATION_SPRAY | Freq: Four times a day (QID) | RESPIRATORY_TRACT | 0 refills | Status: AC | PRN

## 2018-09-07 NOTE — Discharge Instructions (Signed)
Zyrtec daily may also help with symptoms. Use of inhaler as needed.  If symptoms worsen or do not improve in the next week to return to be seen or to follow up with a primary care provider.

## 2018-09-07 NOTE — ED Triage Notes (Signed)
Pt here for medication refill on albuterol inhaler

## 2018-09-07 NOTE — ED Provider Notes (Signed)
[facility]    CSN: [id number] Arrival date & time: 09/07/18  1842     History   Chief Complaint Chief Complaint  Patient presents with  . Medication Refill    HPI Valerie [name] is a 32 y.o. female.   Valerie [name] presents with S/O with complaints of intermittent wheezing, out of inhaler. States she tends to only have wheezing or asthma symptoms in the winter, had felt well all summer. Therefore didn't realize that her inhaler had expired. She has used it and it has helped. Experiences wheezing at night. No cough or congestion. Feels similar to previous asthma flares. No fevers. Occasional shortness of breath. Recent change of insurance, doesn't have a PCP. No chest pain. No rash.   S/o provides spanish interpretation per patient request.   ROS per HPI.      Past Medical History:  Diagnosis Date  . Asthma   . PONV (postoperative nausea and vomiting)     Patient Active Problem List   Diagnosis Date Noted  . DUB (dysfunctional uterine bleeding) 11/17/2017  . Anemia due to blood loss, chronic 11/17/2017  . History of unexplained stillbirth 08/22/2016  . History of 2 cesarean sections 04/29/2016    Past Surgical History:  Procedure Laterality Date  . CESAREAN SECTION    . CESAREAN SECTION WITH BILATERAL TUBAL LIGATION Bilateral 08/29/2016   Procedure: CESAREAN SECTION WITH BILATERAL TUBAL LIGATION;  Surgeon: Adam Phenix, MD;  Location: [facility] BIRTHING SUITES;  Service: Obstetrics;  Laterality: Bilateral;  . CHOLECYSTECTOMY N/A 06/23/2017   Procedure: LAPAROSCOPIC CHOLECYSTECTOMY;  Surgeon: Violeta Gelinas, MD;  Location: [facility] OR;  Service: General;  Laterality: N/A;  . OVARIAN CYST REMOVAL      OB History    Gravida  5   Para  4   Term  3   Preterm  1   AB  1   Living  3     SAB  1   TAB  0   Ectopic  0   Multiple  0   Live Births  3            Home Medications    Prior to Admission medications   Medication Sig Start Date End Date  Taking? Authorizing Provider  albuterol (PROAIR HFA) 108 (90 Base) MCG/ACT inhaler Inhale 1-2 puffs into the lungs every 6 (six) hours as needed for wheezing or shortness of breath. 09/07/18   Georgetta Haber, NP  cetirizine (ZYRTEC) 10 MG tablet Take 1 tablet (10 mg total) by mouth daily. 09/07/18   Georgetta Haber, NP    Family History History reviewed. No pertinent family history.  Social History Social History   Tobacco Use  . Smoking status: Never Smoker  . Smokeless tobacco: Never Used  Substance Use Topics  . Alcohol use: No  . Drug use: No     Allergies   Patient has no known allergies.   Review of Systems Review of Systems   Physical Exam Triage Vital Signs ED Triage Vitals [09/07/18 1931]  Enc Vitals Group     BP (!) 108/58     Pulse Rate 79     Resp 18     Temp 97.7 F (36.5 C)     Temp Source Oral     SpO2 100 %     Weight      Height      Head Circumference      Peak Flow      Pain Score  0     Pain Loc      Pain Edu?      Excl. in GC?    No data found.  Updated Vital Signs BP (!) 108/58 (BP Location: Left Arm)   Pulse 79   Temp 97.7 F (36.5 C) (Oral)   Resp 18   SpO2 100%   Visual Acuity Right Eye Distance:   Left Eye Distance:   Bilateral Distance:    Right Eye Near:   Left Eye Near:    Bilateral Near:     Physical Exam  Constitutional: She is oriented to person, place, and time. She appears well-developed and well-nourished. No distress.  HENT:  Head: Normocephalic and atraumatic.  Right Ear: Tympanic membrane, external ear and ear canal normal.  Left Ear: Tympanic membrane, external ear and ear canal normal.  Nose: Nose normal.  Mouth/Throat: Uvula is midline, oropharynx is clear and moist and mucous membranes are normal. No tonsillar exudate.  Eyes: Pupils are equal, round, and reactive to light. Conjunctivae and EOM are normal.  Cardiovascular: Normal rate, regular rhythm and normal heart sounds.  Pulmonary/Chest:  Effort normal and breath sounds normal.  Lungs clear currently without any wheezing, no increased work of breathing  Neurological: She is alert and oriented to person, place, and time.  Skin: Skin is warm and dry.     UC Treatments / Results  Labs (all labs ordered are listed, but only abnormal results are displayed) Labs Reviewed - No data to display  EKG None  Radiology No results found.  Procedures Procedures (including critical care time)  Medications Ordered in UC Medications - No data to display  Initial Impression / Assessment and Plan / UC Course  I have reviewed the triage vital signs and the nursing notes.  Pertinent labs & imaging results that were available during my care of the patient were reviewed by me and considered in my medical decision making (see chart for details).     No acute distress. Mild intermittent asthma. Inhaler refilled. Recommended zyrtec as allergy symptoms may be triggering asthma flare. Encouraged establish with PCP. Return precautions provided. Patient and husband verbalized understanding and agreeable to plan.   Final Clinical Impressions(s) / UC Diagnoses   Final diagnoses:  Medication refill  Mild intermittent asthma without complication     Discharge Instructions     Zyrtec daily may also help with symptoms. Use of inhaler as needed.  If symptoms worsen or do not improve in the next week to return to be seen or to follow up with a primary care provider.    ED Prescriptions    Medication Sig Dispense Auth. Provider   albuterol (PROAIR HFA) 108 (90 Base) MCG/ACT inhaler Inhale 1-2 puffs into the lungs every 6 (six) hours as needed for wheezing or shortness of breath. 1 Inhaler Linus MakoBurky, Hendrixx Severin B, NP   cetirizine (ZYRTEC) 10 MG tablet Take 1 tablet (10 mg total) by mouth daily. 30 tablet Georgetta HaberBurky, Avry Monteleone B, NP     Controlled Substance Prescriptions [place] Controlled Substance Registry consulted? Not Applicable   Georgetta HaberBurky, Afia Messenger B,  NP 09/07/18 1946

## 2020-01-08 ENCOUNTER — Encounter (HOSPITAL_COMMUNITY): Payer: Self-pay

## 2020-01-08 ENCOUNTER — Other Ambulatory Visit: Payer: Self-pay

## 2020-01-08 ENCOUNTER — Emergency Department (HOSPITAL_COMMUNITY)
Admission: EM | Admit: 2020-01-08 | Discharge: 2020-01-08 | Disposition: A | Discharge status: Home / Self Care | Payer: 59 | Attending: Emergency Medicine | Admitting: Emergency Medicine

## 2020-01-08 DIAGNOSIS — Z79899 Other long term (current) drug therapy: Secondary | ICD-10-CM | POA: Diagnosis not present

## 2020-01-08 DIAGNOSIS — M792 Neuralgia and neuritis, unspecified: Secondary | ICD-10-CM | POA: Insufficient documentation

## 2020-01-08 DIAGNOSIS — M79601 Pain in right arm: Secondary | ICD-10-CM | POA: Diagnosis present

## 2020-01-08 DIAGNOSIS — J45909 Unspecified asthma, uncomplicated: Secondary | ICD-10-CM | POA: Insufficient documentation

## 2020-01-08 MED ORDER — IBUPROFEN 600 MG PO TABS: 600.0000 mg | ORAL_TABLET | Freq: Four times a day (QID) | ORAL | 0 refills | Status: AC | PRN

## 2020-01-08 MED ORDER — CYCLOBENZAPRINE HCL 10 MG PO TABS: 10.0000 mg | ORAL_TABLET | Freq: Two times a day (BID) | ORAL | 0 refills | Status: DC | PRN

## 2020-01-08 MED ORDER — PREDNISONE 20 MG PO TABS: ORAL_TABLET | ORAL | 0 refills | Status: DC

## 2020-01-08 NOTE — ED Notes (Signed)
Patient verbalizes understanding of discharge instructions. Opportunity for questioning and answers were provided. Armband removed by staff, pt discharged from ED.  

## 2020-01-08 NOTE — ED Triage Notes (Signed)
Pt complains of neck pain radiating down right arm since Thursday with chills. A&Ox4, GCS 15, VS stable.

## 2020-01-08 NOTE — ED Provider Notes (Signed)
[facility] Provider Note   CSN: [id number] Arrival date & time: 01/08/20  1833     History No chief complaint on file.   Valerie [name] is a 34 y.o. female.  The history is provided by the patient. The history is limited by a language barrier. A language interpreter was used.  Neck Pain Associated symptoms: no fever and no headaches   Arm Pain Pertinent negatives include no headaches and no shortness of breath.     34 year old Hispanic speaking female presenting complaining of radicular arm pain.  History obtained through language interpreter.  Patient report for the past 3 days she has had sharp shooting pain that started on the right side of her neck radiating down her right arm towards her right wrist.  Pain is moderate to severe, worse with movement, and associate with numbness to her arm.  She denies dropping objects.  She noticed more pain at nighttime.  She is right-hand dominant.  She denies any repetitive movement and currently not holding a job.  She denies any fever but does endorse some chills.  She denies lightheadedness, dizziness, nausea, diaphoresis.  She denies any rash.  No recent strenuous activities.  Has history of asthma.  She has been taking ibuprofen at home without adequate relief.  No significant cardiac history.  Denies tobacco alcohol abuse.  Does endorse some upper chest discomfort but no shortness of breath.  No recent Covid exposure.  Past Medical History:  Diagnosis Date  . Asthma   . PONV (postoperative nausea and vomiting)     Patient Active Problem List   Diagnosis Date Noted  . DUB (dysfunctional uterine bleeding) 11/17/2017  . Anemia due to blood loss, chronic 11/17/2017  . History of unexplained stillbirth 08/22/2016  . History of 2 cesarean sections 04/29/2016    Past Surgical History:  Procedure Laterality Date  . CESAREAN SECTION    . CESAREAN SECTION WITH BILATERAL TUBAL LIGATION Bilateral  08/29/2016   Procedure: CESAREAN SECTION WITH BILATERAL TUBAL LIGATION;  Surgeon: Woodroe Mode, MD;  Location: [facility];  Service: Obstetrics;  Laterality: Bilateral;  . CHOLECYSTECTOMY N/A 06/23/2017   Procedure: LAPAROSCOPIC CHOLECYSTECTOMY;  Surgeon: Georganna Skeans, MD;  Location: [facility];  Service: General;  Laterality: N/A;  . OVARIAN CYST REMOVAL       OB History    Gravida  5   Para  4   Term  3   Preterm  1   AB  1   Living  3     SAB  1   TAB  0   Ectopic  0   Multiple  0   Live Births  3           No family history on file.  Social History   Tobacco Use  . Smoking status: Never Smoker  . Smokeless tobacco: Never Used  Substance Use Topics  . Alcohol use: No  . Drug use: No    Home Medications Prior to Admission medications   Medication Sig Start Date End Date Taking? Authorizing Provider  albuterol (PROAIR HFA) 108 (90 Base) MCG/ACT inhaler Inhale 1-2 puffs into the lungs every 6 (six) hours as needed for wheezing or shortness of breath. 09/07/18   Zigmund Gottron, NP  cetirizine (ZYRTEC) 10 MG tablet Take 1 tablet (10 mg total) by mouth daily. 09/07/18   Zigmund Gottron, NP    Allergies    Patient has no known allergies.  Review of Systems   Review of Systems  Constitutional: Negative for fever.  Respiratory: Negative for shortness of breath.   Musculoskeletal: Positive for neck pain.  Neurological: Negative for light-headedness and headaches.  All other systems reviewed and are negative.   Physical Exam Updated Vital Signs BP 101/71 (BP Location: Left Arm)   Pulse 81   Temp 98.7 F (37.1 C) (Oral)   Resp 17   Ht 5\' 6"  (1.676 m)   SpO2 100%   BMI 33.75 kg/m   Physical Exam Vitals and nursing note reviewed.  Constitutional:      General: She is not in acute distress.    Appearance: She is well-developed.     Comments: Patient is sitting in the chair resting comfortably in no acute discomfort.  HENT:     Head:  Atraumatic.  Eyes:     Conjunctiva/sclera: Conjunctivae normal.  Cardiovascular:     Rate and Rhythm: Normal rate and regular rhythm.  Pulmonary:     Effort: Pulmonary effort is normal.     Breath sounds: Normal breath sounds. No wheezing, rhonchi or rales.  Abdominal:     Palpations: Abdomen is soft.  Musculoskeletal:        General: Tenderness (Tenderness to bilateral shoulder, trapezius, and along right arm without focal point tenderness overlying skin changes.  Radial pulse 2+, decreased grip strength right greater than left.) present.     Cervical back: Neck supple. No rigidity or tenderness.  Skin:    Capillary Refill: Capillary refill takes less than 2 seconds.     Findings: No rash.  Neurological:     Mental Status: She is alert and oriented to person, place, and time.     ED Results / Procedures / Treatments   Labs (all labs ordered are listed, but only abnormal results are displayed) Labs Reviewed - No data to display  EKG None  Radiology No results found.  Procedures Procedures (including critical care time)  Medications Ordered in ED Medications - No data to display  ED Course  I have reviewed the triage vital signs and the nursing notes.  Pertinent labs & imaging results that were available during my care of the patient were reviewed by me and considered in my medical decision making (see chart for details).    MDM Rules/Calculators/A&P                      BP 101/71 (BP Location: Left Arm)   Pulse 81   Temp 98.7 F (37.1 C) (Oral)   Resp 17   Ht 5\' 6"  (1.676 m)   SpO2 100%   BMI 33.75 kg/m   Final Clinical Impression(s) / ED Diagnoses Final diagnoses:  Radicular pain in right arm    Rx / DC Orders ED Discharge Orders         Ordered    predniSONE (DELTASONE) 20 MG tablet     01/08/20 1933    cyclobenzaprine (FLEXERIL) 10 MG tablet  2 times daily PRN     01/08/20 1933    ibuprofen (ADVIL) 600 MG tablet  Every 6 hours PRN     01/08/20  1933         7:27 PM Patient here with radicular neck pain down to her right arm.  Pain is reproducible on exam and likely muscle skeletal pain with radicular component.  Low suspicion for ACS or PE.  Low suspicion for infectious etiology.  Doubt stroke.  Suspect nerve impingement.  Will prescribe course of steroid, muscle relaxant, and anti-inflammatory medication.  Encourage patient to follow-up with PCP for further care.  Return precaution discussed.   Fayrene Helper, PA-C 01/08/20 1934    Pricilla Loveless, MD 01/09/20 [id number]

## 2020-11-07 ENCOUNTER — Other Ambulatory Visit: Payer: Self-pay

## 2020-11-07 ENCOUNTER — Ambulatory Visit (HOSPITAL_COMMUNITY)
Admission: EM | Admit: 2020-11-07 | Discharge: 2020-11-07 | Disposition: A | Discharge status: Home / Self Care | Payer: 59 | Attending: Urgent Care | Admitting: Urgent Care

## 2020-11-07 DIAGNOSIS — Z8742 Personal history of other diseases of the female genital tract: Secondary | ICD-10-CM | POA: Insufficient documentation

## 2020-11-07 DIAGNOSIS — R1032 Left lower quadrant pain: Secondary | ICD-10-CM | POA: Diagnosis not present

## 2020-11-07 LAB — POC URINE PREG, ED: Preg Test, Ur: NEGATIVE

## 2020-11-07 LAB — POCT URINALYSIS DIPSTICK, ED / UC
Bilirubin Urine: NEGATIVE
Glucose, UA: NEGATIVE mg/dL
Ketones, ur: NEGATIVE mg/dL
Nitrite: NEGATIVE
Protein, ur: NEGATIVE mg/dL
Specific Gravity, Urine: >=1.03 (ref 1.005–1.030)
Urobilinogen, UA: 0.2 mg/dL (ref 0.0–1.0)
pH: 5.5 (ref 5.0–8.0)

## 2020-11-07 MED ORDER — NAPROXEN 500 MG PO TABS: 500.0000 mg | ORAL_TABLET | Freq: Two times a day (BID) | ORAL | 0 refills | Status: DC

## 2020-11-07 NOTE — ED Provider Notes (Signed)
[facility]   MRN: [id number] DOB: 04/13/1986  Subjective:   Valerie [name] is a 35 y.o. female presenting for 2-day history of acute onset mid to left lower abdominal pain.  Patient states that the pain is moderate in severity, sharp.  Denies fever, chest pain, shortness of breath, nausea, vomiting, dysuria, hematuria, urinary frequency, vaginal discharge, constipation, diarrhea.  Patient does have significant history of ovarian cyst, has previously had to have it surgically removed.  She also has a history of bilateral tubal ligation.  Denies any history of colon issues, Crohn's disease, diverticulitis.  Has not been able to use a medication that previously worked for her, states that it was an NSAID and given to her because of pelvic pain related to ovarian cyst.  She used to get this medication in [place].  No current facility-administered medications for this encounter.  Current Outpatient Medications:  .  albuterol (PROAIR HFA) 108 (90 Base) MCG/ACT inhaler, Inhale 1-2 puffs into the lungs every 6 (six) hours as needed for wheezing or shortness of breath., Disp: 1 Inhaler, Rfl: 0 .  cetirizine (ZYRTEC) 10 MG tablet, Take 1 tablet (10 mg total) by mouth daily., Disp: 30 tablet, Rfl: 0 .  cyclobenzaprine (FLEXERIL) 10 MG tablet, Take 1 tablet (10 mg total) by mouth 2 (two) times daily as needed for muscle spasms., Disp: 20 tablet, Rfl: 0 .  ibuprofen (ADVIL) 600 MG tablet, Take 1 tablet (600 mg total) by mouth every 6 (six) hours as needed., Disp: 30 tablet, Rfl: 0 .  predniSONE (DELTASONE) 20 MG tablet, 3 tabs po day one, then 2 tabs daily x 4 days, Disp: 11 tablet, Rfl: 0   No Known Allergies  Past Medical History:  Diagnosis Date  . Asthma   . PONV (postoperative nausea and vomiting)      Past Surgical History:  Procedure Laterality Date  . CESAREAN SECTION    . CESAREAN SECTION WITH BILATERAL TUBAL LIGATION Bilateral 08/29/2016   Procedure: CESAREAN SECTION  WITH BILATERAL TUBAL LIGATION;  Surgeon: Adam Phenix, MD;  Location: [facility] SU[facility];  Service: Obstetrics;  Laterality: Bilateral;  . CHOLECYSTECTOMY N/A 06/23/2017   Procedure: LAPAROSCOPIC CHOLECYSTECTOMY;  Surgeon: Violeta Gelinas, MD;  Location: [facility] OR;  Service: General;  Laterality: N/A;  . OVARIAN CYST REMOVAL      No family history on file.  Social History   Tobacco Use  . Smoking status: Never Smoker  . Smokeless tobacco: Never Used  Substance Use Topics  . Alcohol use: No  . Drug use: No    ROS   Objective:   Vitals: BP (!) 104/47 (BP Location: Right Arm)   Pulse 64   Temp 97.6 F (36.4 C) (Oral)   Resp 17   SpO2 100%   Wt Readings from Last 3 Encounters:  01/22/18 209 lb 1.6 oz (94.8 kg)  12/24/17 205 lb 9.6 oz (93.3 kg)  11/17/17 208 lb 3.2 oz (94.4 kg)   Temp Readings from Last 3 Encounters:  11/07/20 97.6 F (36.4 C) (Oral)  01/08/20 98.7 F (37.1 C) (Oral)  09/07/18 97.7 F (36.5 C) (Oral)   BP Readings from Last 3 Encounters:  11/07/20 (!) 104/47  01/08/20 101/71  09/07/18 (!) 108/58   Pulse Readings from Last 3 Encounters:  11/07/20 64  01/08/20 81  09/07/18 79   Physical Exam Constitutional:      General: She is not in acute distress.    Appearance: Normal appearance. She is  well-developed. She is not ill-appearing.  HENT:     Head: Normocephalic and atraumatic.     Nose: Nose normal.     Mouth/Throat:     Mouth: Mucous membranes are moist.     Pharynx: Oropharynx is clear.  Eyes:     General: No scleral icterus.    Extraocular Movements: Extraocular movements intact.     Pupils: Pupils are equal, round, and reactive to light.  Cardiovascular:     Rate and Rhythm: Normal rate.  Pulmonary:     Effort: Pulmonary effort is normal.  Abdominal:     Tenderness: There is generalized abdominal tenderness and tenderness in the epigastric area and left lower quadrant. There is no right CVA tenderness, left CVA tenderness, guarding  or rebound.  Skin:    General: Skin is warm and dry.  Neurological:     General: No focal deficit present.     Mental Status: She is alert and oriented to person, place, and time.  Psychiatric:        Mood and Affect: Mood normal.        Behavior: Behavior normal.     Results for orders placed or performed during the hospital encounter of 11/07/20 (from the past 24 hour(s))  POC Urinalysis dipstick     Status: Abnormal   Collection Time: 11/07/20  5:19 PM  Result Value Ref Range   Glucose, UA NEGATIVE NEGATIVE mg/dL   Bilirubin Urine NEGATIVE NEGATIVE   Ketones, ur NEGATIVE NEGATIVE mg/dL   Specific Gravity, Urine >=1.030 1.005 - 1.030   Hgb urine dipstick TRACE (A) NEGATIVE   pH 5.5 5.0 - 8.0   Protein, ur NEGATIVE NEGATIVE mg/dL   Urobilinogen, UA 0.2 0.0 - 1.0 mg/dL   Nitrite NEGATIVE NEGATIVE   Leukocytes,Ua TRACE (A) NEGATIVE  POC urine pregnancy     Status: None   Collection Time: 11/07/20  5:19 PM  Result Value Ref Range   Preg Test, Ur NEGATIVE NEGATIVE    Assessment and Plan :   PDMP not reviewed this encounter.  1. Left lower quadrant abdominal pain   2. History of ovarian cyst     Suspect recurrent ovarian cyst, recommended naproxen for pain and inflammation.  Hydrate much better.  At this time I do not see signs of PID, acute abdomen.  Maintain strict ER precautions.  [facility]. Counseled patient on potential for adverse effects with medications prescribed today, patient verbalized understanding.    Wallis Bamberg, PA-C 11/07/20 1737

## 2020-11-07 NOTE — ED Triage Notes (Addendum)
Pt presents with left lower quadrant abdominal pain x 2 days; lightheadedness started 3 hrs. Denies vision changes, diarrhea, nausea, fever, vaginal discharge.

## 2020-11-09 LAB — URINE CULTURE

## 2021-07-16 ENCOUNTER — Other Ambulatory Visit: Payer: Self-pay

## 2021-07-16 ENCOUNTER — Encounter (HOSPITAL_COMMUNITY): Payer: Self-pay | Admitting: Emergency Medicine

## 2021-07-16 ENCOUNTER — Ambulatory Visit (HOSPITAL_COMMUNITY)
Admission: EM | Admit: 2021-07-16 | Discharge: 2021-07-16 | Disposition: A | Discharge status: Home / Self Care | Payer: 59 | Attending: Emergency Medicine | Admitting: Emergency Medicine

## 2021-07-16 DIAGNOSIS — H66003 Acute suppurative otitis media without spontaneous rupture of ear drum, bilateral: Secondary | ICD-10-CM

## 2021-07-16 MED ORDER — AMOXICILLIN 500 MG PO CAPS: 500.0000 mg | ORAL_CAPSULE | Freq: Two times a day (BID) | ORAL | 0 refills | Status: AC

## 2021-07-16 NOTE — ED Provider Notes (Signed)
[facility]    CSN: [id number] Arrival date & time: 07/16/21  0846      History   Chief Complaint Chief Complaint  Patient presents with   Otalgia    Bilateral    HPI Valerie [name] is a 35 y.o. female.   Patient here for evaluation of bilateral ear pain that is worse on the left and has been ongoing for the past 8 days.  Denies any fevers or congestion.  Denies any recent sick contacts.  Has not taken any OTC medications or treatments.  Denies any trauma, injury, or other precipitating event.  Denies any specific alleviating or aggravating factors.  Denies any chest pain, shortness of breath, N/V/D, numbness, tingling, weakness, abdominal pain, or headaches.    The history is provided by the patient and a relative. The history is limited by a language barrier.  Otalgia Associated symptoms: no ear discharge and no fever    Past Medical History:  Diagnosis Date   Asthma    PONV (postoperative nausea and vomiting)     Patient Active Problem List   Diagnosis Date Noted   DUB (dysfunctional uterine bleeding) 11/17/2017   Anemia due to blood loss, chronic 11/17/2017   History of unexplained stillbirth 08/22/2016   History of 2 cesarean sections 04/29/2016    Past Surgical History:  Procedure Laterality Date   CESAREAN SECTION     CESAREAN SECTION WITH BILATERAL TUBAL LIGATION Bilateral 08/29/2016   Procedure: CESAREAN SECTION WITH BILATERAL TUBAL LIGATION;  Surgeon: Adam Phenix, MD;  Location: [facility];  Service: Obstetrics;  Laterality: Bilateral;   CHOLECYSTECTOMY N/A 06/23/2017   Procedure: LAPAROSCOPIC CHOLECYSTECTOMY;  Surgeon: Violeta Gelinas, MD;  Location: [facility];  Service: General;  Laterality: N/A;   OVARIAN CYST REMOVAL      OB History     Gravida  5   Para  4   Term  3   Preterm  1   AB  1   Living  3      SAB  1   IAB  0   Ectopic  0   Multiple  0   Live Births  3            Home Medications    Prior  to Admission medications   Medication Sig Start Date End Date Taking? Authorizing Provider  amoxicillin (AMOXIL) 500 MG capsule Take 1 capsule (500 mg total) by mouth 2 (two) times daily for 7 days. 07/16/21 07/23/21 Yes Ivette Loyal, NP  albuterol (PROAIR HFA) 108 (90 Base) MCG/ACT inhaler Inhale 1-2 puffs into the lungs every 6 (six) hours as needed for wheezing or shortness of breath. 09/07/18   Georgetta Haber, NP  cetirizine (ZYRTEC) 10 MG tablet Take 1 tablet (10 mg total) by mouth daily. 09/07/18   Georgetta Haber, NP  cyclobenzaprine (FLEXERIL) 10 MG tablet Take 1 tablet (10 mg total) by mouth 2 (two) times daily as needed for muscle spasms. 01/08/20   Fayrene Helper, PA-C  ibuprofen (ADVIL) 600 MG tablet Take 1 tablet (600 mg total) by mouth every 6 (six) hours as needed. 01/08/20   Fayrene Helper, PA-C  naproxen (NAPROSYN) 500 MG tablet Take 1 tablet (500 mg total) by mouth 2 (two) times daily with a meal. 11/07/20   Wallis Bamberg, PA-C  predniSONE (DELTASONE) 20 MG tablet 3 tabs po day one, then 2 tabs daily x 4 days 01/08/20   Fayrene Helper, PA-C    Family  History History reviewed. No pertinent family history.  Social History Social History   Tobacco Use   Smoking status: Never   Smokeless tobacco: Never  Substance Use Topics   Alcohol use: No   Drug use: No     Allergies   Patient has no known allergies.   Review of Systems Review of Systems  Constitutional:  Negative for fever.  HENT:  Positive for ear pain. Negative for ear discharge and facial swelling.   All other systems reviewed and are negative.   Physical Exam Triage Vital Signs ED Triage Vitals  Enc Vitals Group     BP 07/16/21 1005 104/69     Pulse Rate 07/16/21 1005 (!) 47     Resp 07/16/21 1005 15     Temp 07/16/21 1005 97.9 F (36.6 C)     Temp Source 07/16/21 1005 Oral     SpO2 07/16/21 1005 98 %     Weight --      Height --      Head Circumference --      Peak Flow --      Pain Score 07/16/21 1003  10     Pain Loc --      Pain Edu? --      Excl. in [place]? --    No data found.  Updated Vital Signs BP 104/69 (BP Location: Right Arm)   Pulse (!) 47   Temp 97.9 F (36.6 C) (Oral)   Resp 15   SpO2 98%   Visual Acuity Right Eye Distance:   Left Eye Distance:   Bilateral Distance:    Right Eye Near:   Left Eye Near:    Bilateral Near:     Physical Exam Vitals and nursing note reviewed.  Constitutional:      General: She is not in acute distress.    Appearance: Normal appearance. She is not ill-appearing, toxic-appearing or diaphoretic.  HENT:     Head: Normocephalic and atraumatic.     Right Ear: Ear canal and external ear normal. Tympanic membrane is erythematous and bulging.     Left Ear: Ear canal and external ear normal. Tympanic membrane is erythematous and bulging.  Eyes:     Conjunctiva/sclera: Conjunctivae normal.  Cardiovascular:     Rate and Rhythm: Normal rate.     Pulses: Normal pulses.  Pulmonary:     Effort: Pulmonary effort is normal.  Abdominal:     General: Abdomen is flat.  Musculoskeletal:        General: Normal range of motion.     Cervical back: Normal range of motion.  Skin:    General: Skin is warm and dry.  Neurological:     General: No focal deficit present.     Mental Status: She is alert and oriented to person, place, and time.  Psychiatric:        Mood and Affect: Mood normal.     UC Treatments / Results  Labs (all labs ordered are listed, but only abnormal results are displayed) Labs Reviewed - No data to display  EKG   Radiology No results found.  Procedures Procedures (including critical care time)  Medications Ordered in UC Medications - No data to display  Initial Impression / Assessment and Plan / UC Course  I have reviewed the triage vital signs and the nursing notes.  Pertinent labs & imaging results that were available during my care of the patient were reviewed by me and considered in my medical decision  making (see chart for details).    Assessment negative for red flags or concerns.  Bilateral otitis media.  Will treat with amoxicillin twice daily for the next 7 days.  May take Tylenol and/or ibuprofen as needed.  Encourage fluids and rest.  Follow-up as needed Final Clinical Impressions(s) / UC Diagnoses   Final diagnoses:  Non-recurrent acute suppurative otitis media of both ears without spontaneous rupture of tympanic membranes     Discharge Instructions      Take the amoxicillin twice a day for the next 7 days.   You can take tylenol and/or ibuprofen as needed for pain relief and fever reduction.   Drink plenty of fluids, especially water and rest.   Return or go to the Emergency Department if symptoms worsen or do not improve in the next few days.      ED Prescriptions     Medication Sig Dispense Auth. Provider   amoxicillin (AMOXIL) 500 MG capsule Take 1 capsule (500 mg total) by mouth 2 (two) times daily for 7 days. 14 capsule Ivette Loyal, NP      PDMP not reviewed this encounter.   Ivette Loyal, NP 07/16/21 1021

## 2021-07-16 NOTE — Discharge Instructions (Signed)
Take the amoxicillin twice a day for the next 7 days.   You can take tylenol and/or ibuprofen as needed for pain relief and fever reduction.   Drink plenty of fluids, especially water and rest.   Return or go to the Emergency Department if symptoms worsen or do not improve in the next few days.

## 2021-07-16 NOTE — ED Triage Notes (Signed)
Pt presents with bilateral ear pain Xs 8 days. States left ear is worse than right.

## 2021-09-03 ENCOUNTER — Encounter (HOSPITAL_COMMUNITY): Payer: Self-pay | Admitting: Emergency Medicine

## 2021-09-03 ENCOUNTER — Ambulatory Visit (HOSPITAL_COMMUNITY)
Admission: EM | Admit: 2021-09-03 | Discharge: 2021-09-03 | Disposition: A | Discharge status: Home / Self Care | Payer: 59 | Attending: Physician Assistant | Admitting: Physician Assistant

## 2021-09-03 DIAGNOSIS — M5441 Lumbago with sciatica, right side: Secondary | ICD-10-CM | POA: Diagnosis not present

## 2021-09-03 MED ORDER — KETOROLAC TROMETHAMINE 30 MG/ML IJ SOLN
30.0000 mg | Freq: Once | INTRAMUSCULAR | Status: AC
  Administered 2021-09-03: 30 mg via INTRAMUSCULAR

## 2021-09-03 MED ORDER — TIZANIDINE HCL 4 MG PO CAPS: 4.0000 mg | ORAL_CAPSULE | Freq: Three times a day (TID) | ORAL | 0 refills | Status: DC | PRN

## 2021-09-03 MED ORDER — KETOROLAC TROMETHAMINE 30 MG/ML IJ SOLN
INTRAMUSCULAR | Status: AC
  Filled 2021-09-03: qty 1

## 2021-09-03 MED ORDER — PREDNISONE 10 MG (21) PO TBPK: ORAL_TABLET | ORAL | 0 refills | Status: DC

## 2021-09-03 NOTE — Discharge Instructions (Signed)
We are going to start a prednisone taper.  Please begin this tomorrow (09/04/2021).  Do not take NSAIDs including aspirin, ibuprofen/Advil, naproxen/Aleve with this medication because it can cause stomach bleeding.  You can use Tylenol for breakthrough pain.  Start Zanaflex 3 times a day.  This make you sleepy do not drive or drink alcohol while taking it.  Use heat, rest, stretch for additional symptom relief.  We are going to try to find you a primary care provider so we can get you set up with physical therapy.  If you have any worsening symptoms including increased pain, inability to walk, going to the bathroom and yourself without noticing it you need to go to the ER as we discussed.

## 2021-09-03 NOTE — ED Provider Notes (Signed)
[place]    CSN: [id number] Arrival date & time: 09/03/21  [id number]      History   Chief Complaint Chief Complaint  Patient presents with   Back Pain   Leg Pain    HPI Valerie [name] is a 35 y.o. female.   Patient presents today accompanied by her husband who provides majority of history after declining video interpreter.  Reports 2-day history of right-sided lower back pain with radiation throughout the leg.  Pain is rated 10 on a 0-10 pain scale, localized to right lower back with radiation throughout right leg, described as sharp, worse with certain movements or attempted ambulation, no alleviating factors notified.  Denies history of back injury or surgery.  Denies history of cancer.  She has not tried any over-the-counter medication for symptom management.  She denies difficulty ambulating, lower extremity weakness, bowel/bladder incontinence, saddle anesthesia.  She denies any urinary symptoms including frequency, urgency, hematuria, abdominal pain, fever, nausea, vomiting.  She is confident that she is not pregnant.   Past Medical History:  Diagnosis Date   Asthma    PONV (postoperative nausea and vomiting)     Patient Active Problem List   Diagnosis Date Noted   DUB (dysfunctional uterine bleeding) 11/17/2017   Anemia due to blood loss, chronic 11/17/2017   History of unexplained stillbirth 08/22/2016   History of 2 cesarean sections 04/29/2016    Past Surgical History:  Procedure Laterality Date   CESAREAN SECTION     CESAREAN SECTION WITH BILATERAL TUBAL LIGATION Bilateral 08/29/2016   Procedure: CESAREAN SECTION WITH BILATERAL TUBAL LIGATION;  Surgeon: Woodroe Mode, MD;  Location: [place];  Service: Obstetrics;  Laterality: Bilateral;   CHOLECYSTECTOMY N/A 06/23/2017   Procedure: LAPAROSCOPIC CHOLECYSTECTOMY;  Surgeon: Georganna Skeans, MD;  Location: [place];  Service: General;  Laterality: N/A;   OVARIAN CYST REMOVAL      OB History      Gravida  5   Para  4   Term  3   Preterm  1   AB  1   Living  3      SAB  1   IAB  0   Ectopic  0   Multiple  0   Live Births  3            Home Medications    Prior to Admission medications   Medication Sig Start Date End Date Taking? Authorizing Provider  predniSONE (STERAPRED UNI-PAK 21 TAB) 10 MG (21) TBPK tablet As directed 09/03/21  Yes Loana Salvaggio K, PA-C  tiZANidine (ZANAFLEX) 4 MG capsule Take 1 capsule (4 mg total) by mouth 3 (three) times daily as needed for muscle spasms. 09/03/21  Yes Sixto Bowdish K, PA-C  albuterol (PROAIR HFA) 108 (90 Base) MCG/ACT inhaler Inhale 1-2 puffs into the lungs every 6 (six) hours as needed for wheezing or shortness of breath. 09/07/18   Zigmund Gottron, NP  ibuprofen (ADVIL) 600 MG tablet Take 1 tablet (600 mg total) by mouth every 6 (six) hours as needed. 01/08/20   Domenic Moras, PA-C    Family History History reviewed. No pertinent family history.  Social History Social History   Tobacco Use   Smoking status: Never   Smokeless tobacco: Never  Substance Use Topics   Alcohol use: No   Drug use: No     Allergies   Patient has no known allergies.   Review of Systems Review of Systems  Constitutional:  Positive  for activity change. Negative for appetite change, fatigue and fever.  Respiratory:  Negative for cough and shortness of breath.   Cardiovascular:  Negative for chest pain.  Gastrointestinal:  Negative for abdominal pain, diarrhea, nausea and vomiting.  Genitourinary:  Negative for dysuria, frequency, hematuria and urgency.  Musculoskeletal:  Positive for back pain and gait problem. Negative for arthralgias, joint swelling and myalgias.  Neurological:  Negative for dizziness, weakness, light-headedness, numbness and headaches.    Physical Exam Triage Vital Signs ED Triage Vitals  Enc Vitals Group     BP 09/03/21 1105 134/85     Pulse Rate 09/03/21 1105 67     Resp 09/03/21 1105 18     Temp  09/03/21 1105 97.8 F (36.6 C)     Temp Source 09/03/21 1105 Oral     SpO2 09/03/21 1105 100 %     Weight --      Height --      Head Circumference --      Peak Flow --      Pain Score 09/03/21 1103 10     Pain Loc --      Pain Edu? --      Excl. in [place]? --    No data found.  Updated Vital Signs BP 134/85 (BP Location: Right Arm)   Pulse 67   Temp 97.8 F (36.6 C) (Oral)   Resp 18   SpO2 100%   Visual Acuity Right Eye Distance:   Left Eye Distance:   Bilateral Distance:    Right Eye Near:   Left Eye Near:    Bilateral Near:     Physical Exam Vitals reviewed.  Constitutional:      General: She is awake. She is not in acute distress.    Appearance: Normal appearance. She is well-developed. She is not ill-appearing.     Comments: Very pleasant female appears stated age no acute distress sitting comfortably in exam room  HENT:     Head: Normocephalic and atraumatic.  Cardiovascular:     Rate and Rhythm: Normal rate and regular rhythm.     Heart sounds: Normal heart sounds, S1 normal and S2 normal. No murmur heard. Pulmonary:     Effort: Pulmonary effort is normal.     Breath sounds: Normal breath sounds. No wheezing, rhonchi or rales.     Comments: Clear to auscultation bilaterally Abdominal:     Palpations: Abdomen is soft.     Tenderness: There is no abdominal tenderness. There is no right CVA tenderness or left CVA tenderness.  Musculoskeletal:     Cervical back: No tenderness or bony tenderness.     Thoracic back: No tenderness or bony tenderness.     Lumbar back: Spasms and tenderness present. No bony tenderness. Decreased range of motion.     Comments: Back: No pain percussion of vertebrae.  Tenderness palpation of right lumbar paraspinal muscles.  Decreased range of motion with rotation, forward flexion, extension secondary to pain.  Patient unable to lie flat on exam room table for straight leg raise due to pain.  Psychiatric:        Behavior: Behavior is  cooperative.     UC Treatments / Results  Labs (all labs ordered are listed, but only abnormal results are displayed) Labs Reviewed - No data to display  EKG   Radiology No results found.  Procedures Procedures (including critical care time)  Medications Ordered in UC Medications  ketorolac (TORADOL) 30 MG/ML injection 30 mg (30  mg Intramuscular Given 09/03/21 1139)    Initial Impression / Assessment and Plan / UC Course  I have reviewed the triage vital signs and the nursing notes.  Pertinent labs & imaging results that were available during my care of the patient were reviewed by me and considered in my medical decision making (see chart for details).     No indication for plain films given patient denies any recent trauma and had no bony tenderness on exam.  No alarm symptoms that warrant emergent evaluation.  Patient was given Toradol in clinic without improvement of symptoms.  We will try prednisone taper and patient was instructed not to take NSAIDs with this medication due to risk of GI bleeding.  She was also prescribed Zanaflex to be used up to 3 times a day with instruction not to drive drink alcohol due to risk of sedation.  Recommended conservative treatment including heat, rest, stretch for symptom relief.  Discussed potential utility of seeing a physical therapist or having imaging performed if symptoms do not improve quickly; this should be arranged through PCP so we will try to find patient a PCP in this area for follow-up and management.  Discussed alarm symptoms that warrant emergent evaluation.  Strict return precautions given to which she expressed understanding.  Final Clinical Impressions(s) / UC Diagnoses   Final diagnoses:  Acute right-sided low back pain with right-sided sciatica     Discharge Instructions      We are going to start a prednisone taper.  Please begin this tomorrow (09/04/2021).  Do not take NSAIDs including aspirin, ibuprofen/Advil,  naproxen/Aleve with this medication because it can cause stomach bleeding.  You can use Tylenol for breakthrough pain.  Start Zanaflex 3 times a day.  This make you sleepy do not drive or drink alcohol while taking it.  Use heat, rest, stretch for additional symptom relief.  We are going to try to find you a primary care provider so we can get you set up with physical therapy.  If you have any worsening symptoms including increased pain, inability to walk, going to the bathroom and yourself without noticing it you need to go to the ER as we discussed.     ED Prescriptions     Medication Sig Dispense Auth. Provider   tiZANidine (ZANAFLEX) 4 MG capsule Take 1 capsule (4 mg total) by mouth 3 (three) times daily as needed for muscle spasms. 21 capsule Logan Baltimore K, PA-C   predniSONE (STERAPRED UNI-PAK 21 TAB) 10 MG (21) TBPK tablet As directed 21 tablet Divina Neale K, PA-C      PDMP not reviewed this encounter.   Jeani Hawking, PA-C 09/03/21 1223

## 2021-09-03 NOTE — ED Triage Notes (Signed)
Pt reports that since Saturday having righ lid-lower back pains that radiates down right leg to knee. Reports was lifting furniture but nothing to heavy.

## 2021-12-29 ENCOUNTER — Other Ambulatory Visit: Payer: Self-pay

## 2021-12-29 ENCOUNTER — Emergency Department (HOSPITAL_COMMUNITY): Payer: 59

## 2021-12-29 ENCOUNTER — Encounter (HOSPITAL_COMMUNITY): Payer: Self-pay | Admitting: *Deleted

## 2021-12-29 ENCOUNTER — Emergency Department (HOSPITAL_COMMUNITY)
Admission: EM | Admit: 2021-12-29 | Discharge: 2021-12-29 | Disposition: A | Discharge status: Home / Self Care | Payer: 59 | Attending: Emergency Medicine | Admitting: Emergency Medicine

## 2021-12-29 DIAGNOSIS — W19XXXA Unspecified fall, initial encounter: Secondary | ICD-10-CM

## 2021-12-29 DIAGNOSIS — S92144A Nondisplaced dome fracture of right talus, initial encounter for closed fracture: Secondary | ICD-10-CM | POA: Diagnosis not present

## 2021-12-29 DIAGNOSIS — W11XXXA Fall on and from ladder, initial encounter: Secondary | ICD-10-CM | POA: Insufficient documentation

## 2021-12-29 DIAGNOSIS — S0990XA Unspecified injury of head, initial encounter: Secondary | ICD-10-CM | POA: Insufficient documentation

## 2021-12-29 DIAGNOSIS — S01111A Laceration without foreign body of right eyelid and periocular area, initial encounter: Secondary | ICD-10-CM | POA: Insufficient documentation

## 2021-12-29 DIAGNOSIS — S80211A Abrasion, right knee, initial encounter: Secondary | ICD-10-CM | POA: Insufficient documentation

## 2021-12-29 DIAGNOSIS — Y93E5 Activity, floor mopping and cleaning: Secondary | ICD-10-CM | POA: Insufficient documentation

## 2021-12-29 DIAGNOSIS — S99911A Unspecified injury of right ankle, initial encounter: Secondary | ICD-10-CM | POA: Diagnosis present

## 2021-12-29 DIAGNOSIS — S92101A Unspecified fracture of right talus, initial encounter for closed fracture: Secondary | ICD-10-CM

## 2021-12-29 DIAGNOSIS — S0181XA Laceration without foreign body of other part of head, initial encounter: Secondary | ICD-10-CM

## 2021-12-29 DIAGNOSIS — Z23 Encounter for immunization: Secondary | ICD-10-CM | POA: Diagnosis not present

## 2021-12-29 LAB — I-STAT BETA HCG BLOOD, ED (MC, WL, AP ONLY): I-stat hCG, quantitative: <5 m[IU]/mL (ref <5)

## 2021-12-29 MED ORDER — TETANUS-DIPHTH-ACELL PERTUSSIS 5-2.5-18.5 LF-MCG/0.5 IM SUSY
0.5000 mL | PREFILLED_SYRINGE | Freq: Once | INTRAMUSCULAR | Status: AC
  Administered 2021-12-29: 0.5 mL via INTRAMUSCULAR
  Filled 2021-12-29: qty 0.5

## 2021-12-29 MED ORDER — LIDOCAINE HCL (PF) 1 % IJ SOLN
10.0000 mL | Freq: Once | INTRAMUSCULAR | Status: AC
  Administered 2021-12-29: 10 mL
  Filled 2021-12-29: qty 10

## 2021-12-29 MED ORDER — CEFAZOLIN SODIUM-DEXTROSE 1-4 GM/50ML-% IV SOLN
1.0000 g | Freq: Once | INTRAVENOUS | Status: AC
  Administered 2021-12-29: 1 g via INTRAVENOUS
  Filled 2021-12-29: qty 50

## 2021-12-29 NOTE — Discharge Instructions (Addendum)
You can walk as pain allows on the foot.  Follow-up with orthopedic surgery in about a week.  Follow-up with ENT this next week for sutures and evaluation of the wound.  Motrin and Tylenol may help with the pain of the foot. ?

## 2021-12-29 NOTE — ED Provider Notes (Signed)
..  Laceration Repair  Date/Time: 12/29/2021 7:02 PM Performed by: Haskel Schroeder, PA-C Authorized by: Haskel Schroeder, PA-C   Consent:    Consent obtained:  Verbal   Consent given by:  Patient   Risks discussed:  Infection, need for additional repair, pain, poor cosmetic result and poor wound healing   Alternatives discussed:  No treatment and delayed treatment Universal protocol:    Procedure explained and questions answered to patient or proxy's satisfaction: yes     Patient identity confirmed:  Verbally with patient and arm band Anesthesia:    Anesthesia method:  Local infiltration   Local anesthetic:  Lidocaine 1% w/o epi Laceration details:    Location:  Face   Face location:  R eyebrow   Length (cm):  7 Pre-procedure details:    Preparation:  Patient was prepped and draped in usual sterile fashion Exploration:    Wound extent: no foreign bodies/material noted   Treatment:    Area cleansed with:  Povidone-iodine   Amount of cleaning:  Standard   Irrigation solution:  Sterile water   Irrigation volume:  Skin repair:    Repair method:  Sutures   Suture size:  6-0   Suture material:  Prolene   Suture technique:  Simple interrupted   Number of sutures:  13 Approximation:    Approximation:  Close Repair type:    Repair type:  Complex Post-procedure details:    Dressing:  Non-adherent dressing   Procedure completion:  Tolerated well, no immediate complications    Berneice Heinrich 12/29/21 1903    Benjiman Core, MD 12/29/21 2106

## 2021-12-29 NOTE — ED Triage Notes (Signed)
Pt was 8 feet up on a ladder cleaning windows and fell, landing on the floor.  Pt does not remember falling, but family states if she lost consciousness, it was only a few seconds.  AO x 4.  Pupils equal and reactive.  Denies neck pain, c/o R leg, ankle, elbow and R ear pain. ?

## 2021-12-29 NOTE — ED Provider Notes (Signed)
[facility]   CSN: [id number] Arrival date & time: 12/29/21  1440     History  Chief Complaint  Patient presents with   Laceration    Valerie [name] is a 36 y.o. female.   Laceration Patient presents with a fall.  Was reportedly up on a ladder cleaning windows and fell hitting her head.  Unsure exactly if she was unconscious.  Complain of pain in right lower leg and in her head.  Large laceration to right forehead.  Unsure of last tetanus potentially 5 years ago.  Speaks Spanish but has family member translated.    Home Medications Prior to Admission medications   Medication Sig Start Date End Date Taking? Authorizing Provider  albuterol (PROAIR HFA) 108 (90 Base) MCG/ACT inhaler Inhale 1-2 puffs into the lungs every 6 (six) hours as needed for wheezing or shortness of breath. 09/07/18   Georgetta HaberBurky, Natalie B, NP  ibuprofen (ADVIL) 600 MG tablet Take 1 tablet (600 mg total) by mouth every 6 (six) hours as needed. 01/08/20   Fayrene Helperran, [place], PA-C  predniSONE (STERAPRED UNI-PAK 21 TAB) 10 MG (21) TBPK tablet As directed 09/03/21   Raspet, Erin K, PA-C  tiZANidine (ZANAFLEX) 4 MG capsule Take 1 capsule (4 mg total) by mouth 3 (three) times daily as needed for muscle spasms. 09/03/21   Raspet, Noberto RetortErin K, PA-C      Allergies    Patient has no known allergies.    Review of Systems   Review of Systems  Constitutional:  Negative for appetite change.  Eyes:  Negative for pain and redness.  Respiratory:  Negative for chest tightness.   Gastrointestinal:  Negative for abdominal pain.  Musculoskeletal:        Right lower leg and knee pain.  Neurological:  Negative for weakness.   Physical Exam Updated Vital Signs BP (!) 119/58 (BP Location: Right Arm)    Pulse 84    Temp 98.4 F (36.9 C) (Oral)    Resp (!) 22    Ht 5\' 6"  (1.676 m)    Wt 94.8 kg    SpO2 100%    BMI 33.73 kg/m  Physical Exam HENT:     Head:     Comments: Large  laceration above right eye on right forehead.  Goes through eyebrow.  Unable to raise eyebrow.  Visible skull.  Picture attached. Eyes:     Extraocular Movements: Extraocular movements intact.     Pupils: Pupils are equal, round, and reactive to light.  Cardiovascular:     Rate and Rhythm: Regular rhythm.  Chest:     Chest wall: No tenderness.  Abdominal:     Tenderness: There is no abdominal tenderness.  Musculoskeletal:        General: Tenderness present.     Cervical back: Neck supple.     Comments: Abrasion to right knee with underlying tenderness.  Decreased range of motion.  Has some abrasion lateral to right knee also.  Tenderness to mid tibia.  Also tenderness to right ankle laterally.  Neurovascular intact in foot.  No tenderness over hip.  No pelvis tenderness.  Neurological:     Mental Status: She is alert.     ED Results / Procedures / Treatments   Labs (all labs ordered are listed, but only abnormal results are displayed) Labs Reviewed  I-STAT BETA HCG BLOOD, ED ([facility], [facility], [facility] ONLY)    EKG None  Radiology DG Tibia/Fibula Right  Result Date:  12/29/2021 CLINICAL DATA:  RIGHT LOWER leg pain following injury. Initial encounter. EXAM: RIGHT TIBIA AND FIBULA - 2 VIEW COMPARISON:  None. FINDINGS: There is no evidence of fracture or other focal bone lesions. Soft tissues are unremarkable. IMPRESSION: Negative. Electronically Signed   By: Harmon Pier M.D.   On: 12/29/2021 16:18   DG Ankle Complete Right  Result Date: 12/29/2021 CLINICAL DATA:  Acute RIGHT ankle pain following fall. Initial encounter. EXAM: RIGHT ANKLE - COMPLETE 3+ VIEW COMPARISON:  None. FINDINGS: There is a transverse fracture of the LATERAL process of the talus with 2 mm distraction. No other fracture, subluxation or dislocation identified. LATERAL soft tissue swelling is noted. IMPRESSION: Fracture of the LATERAL process of the talus. Electronically Signed   By: Harmon Pier M.D.   On: 12/29/2021 16:18   CT  HEAD WO CONTRAST ( )  Result Date: 12/29/2021 CLINICAL DATA:  Head trauma. Fall from a ladder. Right forehead laceration. EXAM: CT HEAD WITHOUT CONTRAST CT MAXILLOFACIAL WITHOUT CONTRAST TECHNIQUE: Multidetector CT imaging of the head and maxillofacial structures were performed using the standard protocol without intravenous contrast. Multiplanar CT image reconstructions of the maxillofacial structures were also generated. RADIATION DOSE REDUCTION: This exam was performed according to the departmental dose-optimization program which includes automated exposure control, adjustment of the mA and/or kV according to patient size and/or use of iterative reconstruction technique. COMPARISON:  None. FINDINGS: CT HEAD FINDINGS Brain: There is no evidence of an acute infarct, intracranial hemorrhage, mass, midline shift, or extra-axial fluid collection. The ventricles and sulci are normal. A dilated perivascular space is incidentally noted at the inferior aspect of the left basal ganglia. Vascular: No hyperdense vessel. Skull: No acute fracture or suspicious osseous lesion. Other: Right forehead laceration and mild soft tissue swelling. CT MAXILLOFACIAL FINDINGS Osseous: No acute fracture, mandibular dislocation, or destructive osseous process. Orbits: Unremarkable. Sinuses: Minimal mucosal thickening in the maxillary sinuses. No sinus fluid. Clear mastoid air cells. Soft tissues: Right forehead laceration. IMPRESSION: 1. No evidence of acute intracranial abnormality. 2. Right forehead laceration and soft tissue swelling. 3. No acute maxillofacial fracture. Electronically Signed   By: Sebastian Ache M.D.   On: 12/29/2021 16:58   DG Knee Complete 4 Views Right  Result Date: 12/29/2021 CLINICAL DATA:  Acute RIGHT knee pain following fall. Initial encounter. EXAM: RIGHT KNEE - COMPLETE 4+ VIEW COMPARISON:  None. FINDINGS: No evidence of fracture, dislocation, or joint effusion. No evidence of arthropathy or other focal  bone abnormality. Soft tissues are unremarkable. IMPRESSION: Negative. Electronically Signed   By: Harmon Pier M.D.   On: 12/29/2021 16:16   DG Foot Complete Right  Result Date: 12/29/2021 CLINICAL DATA:  Acute RIGHT foot pain following injury. Initial encounter. EXAM: RIGHT FOOT COMPLETE - 3+ VIEW COMPARISON:  None. FINDINGS: No acute fracture, subluxation or dislocation. The Lisfranc joints are unremarkable. The known talus fracture identified on the ankle radiographs is difficult to visualize on this study. IMPRESSION: No evidence of acute abnormality. The known talus fracture identified on the ankle radiographs is difficult to visualize on this study. Electronically Signed   By: Harmon Pier M.D.   On: 12/29/2021 17:01   CT Maxillofacial Wo Contrast  Result Date: 12/29/2021 CLINICAL DATA:  Head trauma. Fall from a ladder. Right forehead laceration. EXAM: CT HEAD WITHOUT CONTRAST CT MAXILLOFACIAL WITHOUT CONTRAST TECHNIQUE: Multidetector CT imaging of the head and maxillofacial structures were performed using the standard protocol without intravenous contrast. Multiplanar CT image reconstructions of the maxillofacial structures were  also generated. RADIATION DOSE REDUCTION: This exam was performed according to the departmental dose-optimization program which includes automated exposure control, adjustment of the mA and/or kV according to patient size and/or use of iterative reconstruction technique. COMPARISON:  None. FINDINGS: CT HEAD FINDINGS Brain: There is no evidence of an acute infarct, intracranial hemorrhage, mass, midline shift, or extra-axial fluid collection. The ventricles and sulci are normal. A dilated perivascular space is incidentally noted at the inferior aspect of the left basal ganglia. Vascular: No hyperdense vessel. Skull: No acute fracture or suspicious osseous lesion. Other: Right forehead laceration and mild soft tissue swelling. CT MAXILLOFACIAL FINDINGS Osseous: No acute fracture,  mandibular dislocation, or destructive osseous process. Orbits: Unremarkable. Sinuses: Minimal mucosal thickening in the maxillary sinuses. No sinus fluid. Clear mastoid air cells. Soft tissues: Right forehead laceration. IMPRESSION: 1. No evidence of acute intracranial abnormality. 2. Right forehead laceration and soft tissue swelling. 3. No acute maxillofacial fracture. Electronically Signed   By: Sebastian Ache M.D.   On: 12/29/2021 16:58    Procedures Procedures    Medications Ordered in ED Medications  Tdap (BOOSTRIX) injection 0.5 mL (0.5 mLs Intramuscular Given 12/29/21 1641)  ceFAZolin (ANCEF) IVPB 1 g/50 mL premix (0 g Intravenous Stopped 12/29/21 1709)  lidocaine (PF) (XYLOCAINE) 1 % injection 10 mL (10 mLs Infiltration Given 12/29/21 2008)    ED Course/ Medical Decision Making/ A&P                           Medical Decision Making Amount and/or Complexity of Data Reviewed Radiology: ordered.  Risk Prescription drug management.   Patient with fall.  Larey Seat off a ladder.  Hit head.  Also right ankle pain.  Found to have talus fracture on x-ray.  Discussed with Dr. Steward Drone from orthopedic surgery.  Cam walker weight-bear as tolerated outpatient follow-up.  X-ray independently interpreted.  Also head CT independently interpreted and shows no intracranial hemorrhage.  However does have large facial laceration.  Discussed with Dr. Pollyann Kennedy who after discussion feels that would be closed in the ER.  Closed by midlevel.  We will follow-up with ENT for further evaluation.  Does not require admission to the hospital.  Discharge home.        Final Clinical Impression(s) / ED Diagnoses Final diagnoses:  Facial laceration, initial encounter  Fall, initial encounter  Closed nondisplaced fracture of right talus, unspecified portion of talus, initial encounter    Rx / DC Orders ED Discharge Orders     None         Benjiman Core, MD 12/29/21 2103

## 2021-12-29 NOTE — ED Notes (Signed)
RN reviewed discharge instructions with pt. Pt verbalized understanding and had no further questions. VSS upon discharge.  

## 2021-12-29 NOTE — ED Notes (Signed)
Patient transported to X-ray 

## 2021-12-29 NOTE — Progress Notes (Signed)
Orthopedic Tech Progress Note ?Patient Details:  ?Valerie [name] ?[birth year] ?[id number] ? ?Ortho Devices ?Type of Ortho Device: CAM walker ?Ortho Device/Splint Location: Right Foot ?Ortho Device/Splint Interventions: Application ?  ?Post Interventions ?Patient Tolerated: Well ? ?Valerie [name] ?12/29/2021, 5:18 PM ? ?

## 2021-12-31 ENCOUNTER — Encounter (HOSPITAL_COMMUNITY): Payer: Self-pay | Admitting: Student

## 2021-12-31 ENCOUNTER — Emergency Department (HOSPITAL_COMMUNITY)
Admission: EM | Admit: 2021-12-31 | Discharge: 2021-12-31 | Disposition: A | Discharge status: Home / Self Care | Payer: 59 | Attending: Emergency Medicine | Admitting: Emergency Medicine

## 2021-12-31 DIAGNOSIS — M25571 Pain in right ankle and joints of right foot: Secondary | ICD-10-CM | POA: Diagnosis present

## 2021-12-31 DIAGNOSIS — W11XXXA Fall on and from ladder, initial encounter: Secondary | ICD-10-CM | POA: Insufficient documentation

## 2021-12-31 DIAGNOSIS — R001 Bradycardia, unspecified: Secondary | ICD-10-CM | POA: Insufficient documentation

## 2021-12-31 MED ORDER — OXYCODONE-ACETAMINOPHEN 5-325 MG PO TABS
1.0000 | ORAL_TABLET | Freq: Four times a day (QID) | ORAL | 0 refills | Status: DC | PRN
Start: 2021-12-31 — End: 2024-03-04

## 2021-12-31 MED ORDER — NAPROXEN 500 MG PO TBEC: 500.0000 mg | DELAYED_RELEASE_TABLET | Freq: Two times a day (BID) | ORAL | 0 refills | Status: DC | PRN

## 2021-12-31 MED ORDER — ONDANSETRON 4 MG PO TBDP: 4.0000 mg | ORAL_TABLET | Freq: Three times a day (TID) | ORAL | 0 refills | Status: DC | PRN

## 2021-12-31 MED ORDER — FAMOTIDINE 20 MG PO TABS: 20.0000 mg | ORAL_TABLET | Freq: Two times a day (BID) | ORAL | 0 refills | Status: DC | PRN

## 2021-12-31 MED ORDER — OXYCODONE-ACETAMINOPHEN 5-325 MG PO TABS
1.0000 | ORAL_TABLET | ORAL | Status: DC | PRN
  Administered 2021-12-31: 1 via ORAL
  Filled 2021-12-31: qty 1

## 2021-12-31 NOTE — ED Provider Notes (Signed)
[facility] EMERGENCY DEPARTMENT Provider Note   CSN: [id number] Arrival date & time: 12/31/21  0029     History  Chief Complaint  Patient presents with   Pain    Miasia Crabtree is a 36 y.o. female who returns to the ED with request for pain medication. Patient reports she was seen in the emergency department yesterday after a fall from a ladder during which she cut her face and fractured her right ankle.  She has not taking Motrin/Tylenol as needed for pain without significant relief.  She was given a Percocet in triage which has helped.  She denies reinjury.  Denies numbness, tingling, or weakness.  Offered formal interpreter, patient preferred to have family member translate.  HPI     Home Medications Prior to Admission medications   Medication Sig Start Date End Date Taking? Authorizing Provider  albuterol (PROAIR HFA) 108 (90 Base) MCG/ACT inhaler Inhale 1-2 puffs into the lungs every 6 (six) hours as needed for wheezing or shortness of breath. 09/07/18   Georgetta Haber, NP  ibuprofen (ADVIL) 600 MG tablet Take 1 tablet (600 mg total) by mouth every 6 (six) hours as needed. 01/08/20   Fayrene Helper, PA-C  predniSONE (STERAPRED UNI-PAK 21 TAB) 10 MG (21) TBPK tablet As directed 09/03/21   Raspet, Erin K, PA-C  tiZANidine (ZANAFLEX) 4 MG capsule Take 1 capsule (4 mg total) by mouth 3 (three) times daily as needed for muscle spasms. 09/03/21   Raspet, Noberto Retort, PA-C      Allergies    Patient has no known allergies.    Review of Systems   Review of Systems  Constitutional:  Negative for chills and fever.  Respiratory:  Negative for shortness of breath.   Cardiovascular:  Negative for chest pain.  Gastrointestinal:  Negative for abdominal pain.  Musculoskeletal:  Positive for arthralgias.  Neurological:  Negative for weakness and numbness.  All other systems reviewed and are negative.  Physical Exam Updated Vital Signs BP 103/61 (BP Location: Left  Arm)    Pulse (!) 55    Temp 98.1 F (36.7 C) (Oral)    Resp 20    SpO2 97%  Physical Exam Vitals and nursing note reviewed.  Constitutional:      General: She is not in acute distress.    Appearance: She is not toxic-appearing.  HENT:     Head:     Comments: Right forehead/eyebrow laceration with some mild swelling to the right periorbital region.  Sutures in place.  No erythema or purulent drainage.  Pupils equal round and reactive. Cardiovascular:     Rate and Rhythm: Regular rhythm. Bradycardia present.     Comments: 2+ symmetric DP pulses bilaterally. Pulmonary:     Effort: Pulmonary effort is normal.     Breath sounds: Normal breath sounds.  Musculoskeletal:     Comments: Lower extremities: Right ankle in walking boot which was removed for further evaluation, mild right ankle swelling especially laterally.  No significant open wounds.  Tender to palpation to the right lateral/anterior ankle.  Otherwise no significant tenderness.  No calf tenderness.  Compartments are soft.  Skin:    General: Skin is warm and dry.     Capillary Refill: Capillary refill takes less than 2 seconds.  Neurological:     Mental Status: She is alert.     Comments: Sensation grossly tact bilateral lower extremities.  5-5 strength with plantar dorsiflexion bilaterally.  Psychiatric:  Mood and Affect: Mood normal.        Behavior: Behavior normal.    ED Results / Procedures / Treatments   Labs (all labs ordered are listed, but only abnormal results are displayed) Labs Reviewed - No data to display  EKG None  Radiology DG Tibia/Fibula Right  Result Date: 12/29/2021 CLINICAL DATA:  RIGHT LOWER leg pain following injury. Initial encounter. EXAM: RIGHT TIBIA AND FIBULA - 2 VIEW COMPARISON:  None. FINDINGS: There is no evidence of fracture or other focal bone lesions. Soft tissues are unremarkable. IMPRESSION: Negative. Electronically Signed   By: Harmon PierJeffrey  Hu M.D.   On: 12/29/2021 16:18   DG  Ankle Complete Right  Result Date: 12/29/2021 CLINICAL DATA:  Acute RIGHT ankle pain following fall. Initial encounter. EXAM: RIGHT ANKLE - COMPLETE 3+ VIEW COMPARISON:  None. FINDINGS: There is a transverse fracture of the LATERAL process of the talus with 2 mm distraction. No other fracture, subluxation or dislocation identified. LATERAL soft tissue swelling is noted. IMPRESSION: Fracture of the LATERAL process of the talus. Electronically Signed   By: Harmon PierJeffrey  Hu M.D.   On: 12/29/2021 16:18   CT HEAD WO CONTRAST (5MM)  Result Date: 12/29/2021 CLINICAL DATA:  Head trauma. Fall from a ladder. Right forehead laceration. EXAM: CT HEAD WITHOUT CONTRAST CT MAXILLOFACIAL WITHOUT CONTRAST TECHNIQUE: Multidetector CT imaging of the head and maxillofacial structures were performed using the standard protocol without intravenous contrast. Multiplanar CT image reconstructions of the maxillofacial structures were also generated. RADIATION DOSE REDUCTION: This exam was performed according to the departmental dose-optimization program which includes automated exposure control, adjustment of the mA and/or kV according to patient size and/or use of iterative reconstruction technique. COMPARISON:  None. FINDINGS: CT HEAD FINDINGS Brain: There is no evidence of an acute infarct, intracranial hemorrhage, mass, midline shift, or extra-axial fluid collection. The ventricles and sulci are normal. A dilated perivascular space is incidentally noted at the inferior aspect of the left basal ganglia. Vascular: No hyperdense vessel. Skull: No acute fracture or suspicious osseous lesion. Other: Right forehead laceration and mild soft tissue swelling. CT MAXILLOFACIAL FINDINGS Osseous: No acute fracture, mandibular dislocation, or destructive osseous process. Orbits: Unremarkable. Sinuses: Minimal mucosal thickening in the maxillary sinuses. No sinus fluid. Clear mastoid air cells. Soft tissues: Right forehead laceration. IMPRESSION: 1.  No evidence of acute intracranial abnormality. 2. Right forehead laceration and soft tissue swelling. 3. No acute maxillofacial fracture. Electronically Signed   By: Sebastian AcheAllen  Grady M.D.   On: 12/29/2021 16:58   DG Knee Complete 4 Views Right  Result Date: 12/29/2021 CLINICAL DATA:  Acute RIGHT knee pain following fall. Initial encounter. EXAM: RIGHT KNEE - COMPLETE 4+ VIEW COMPARISON:  None. FINDINGS: No evidence of fracture, dislocation, or joint effusion. No evidence of arthropathy or other focal bone abnormality. Soft tissues are unremarkable. IMPRESSION: Negative. Electronically Signed   By: Harmon PierJeffrey  Hu M.D.   On: 12/29/2021 16:16   DG Foot Complete Right  Result Date: 12/29/2021 CLINICAL DATA:  Acute RIGHT foot pain following injury. Initial encounter. EXAM: RIGHT FOOT COMPLETE - 3+ VIEW COMPARISON:  None. FINDINGS: No acute fracture, subluxation or dislocation. The Lisfranc joints are unremarkable. The known talus fracture identified on the ankle radiographs is difficult to visualize on this study. IMPRESSION: No evidence of acute abnormality. The known talus fracture identified on the ankle radiographs is difficult to visualize on this study. Electronically Signed   By: Harmon PierJeffrey  Hu M.D.   On: 12/29/2021 17:01   CT  Maxillofacial Wo Contrast  Result Date: 12/29/2021 CLINICAL DATA:  Head trauma. Fall from a ladder. Right forehead laceration. EXAM: CT HEAD WITHOUT CONTRAST CT MAXILLOFACIAL WITHOUT CONTRAST TECHNIQUE: Multidetector CT imaging of the head and maxillofacial structures were performed using the standard protocol without intravenous contrast. Multiplanar CT image reconstructions of the maxillofacial structures were also generated. RADIATION DOSE REDUCTION: This exam was performed according to the departmental dose-optimization program which includes automated exposure control, adjustment of the mA and/or kV according to patient size and/or use of iterative reconstruction technique.  COMPARISON:  None. FINDINGS: CT HEAD FINDINGS Brain: There is no evidence of an acute infarct, intracranial hemorrhage, mass, midline shift, or extra-axial fluid collection. The ventricles and sulci are normal. A dilated perivascular space is incidentally noted at the inferior aspect of the left basal ganglia. Vascular: No hyperdense vessel. Skull: No acute fracture or suspicious osseous lesion. Other: Right forehead laceration and mild soft tissue swelling. CT MAXILLOFACIAL FINDINGS Osseous: No acute fracture, mandibular dislocation, or destructive osseous process. Orbits: Unremarkable. Sinuses: Minimal mucosal thickening in the maxillary sinuses. No sinus fluid. Clear mastoid air cells. Soft tissues: Right forehead laceration. IMPRESSION: 1. No evidence of acute intracranial abnormality. 2. Right forehead laceration and soft tissue swelling. 3. No acute maxillofacial fracture. Electronically Signed   By: Sebastian Ache M.D.   On: 12/29/2021 16:58    Procedures Procedures    Medications Ordered in ED Medications  oxyCODONE-acetaminophen (PERCOCET/ROXICET) 5-325 MG per tablet 1 tablet (1 tablet Oral Given 12/31/21 0055)    ED Course/ Medical Decision Making/ A&P                           Medical Decision Making Risk Prescription drug management.  Patient presents to the emergency department with complaints of right ankle pain not alleviated by Motrin/Tylenol.  Nontoxic, resting comfortably, mildly bradycardic, otherwise vitals are unremarkable.  Chart reviewed for additional history, right ankle x-ray performed yesterday:Fracture of the LATERAL process of the talus.  No open wounds.  Neurovascular intact distally.  No findings of compartment syndrome.  Given patient with fracture with pain uncontrolled by OTC medications will prescribe naproxen and Percocet. [place] Controlled Substance reporting System queried.  Discussed price with orthopedics follow-up.  We will also provide  Zofran/Pepcid as sometimes these medications can upset her stomach.  I discussed treatment plan, need for follow-up, strict return precautions with the patient and her significant other at bedside.  Brought in opportunity for questions, they confirmed understanding and are agreement with plan.         Final Clinical Impression(s) / ED Diagnoses Final diagnoses:  Acute right ankle pain    Rx / DC Orders ED Discharge Orders          Ordered    oxyCODONE-acetaminophen (PERCOCET/ROXICET) 5-325 MG tablet  Every 6 hours PRN        12/31/21 0255    famotidine (PEPCID) 20 MG tablet  2 times daily PRN        12/31/21 0255    ondansetron (ZOFRAN-ODT) 4 MG disintegrating tablet  Every 8 hours PRN        12/31/21 0255    naproxen (EC-NAPROSYN) 500 MG EC tablet  2 times daily PRN        12/31/21 0255              Cherly Anderson, PA-C 12/31/21 0315    Mesner, Barbara Cower, MD 12/31/21 [phone]

## 2021-12-31 NOTE — ED Triage Notes (Signed)
Pt c/o continued R ankle pain after fall from ladder. Known fx of R lateral talus process, only using ibuprofen/tylenol for pain, wants RX for breakthrough pain ?

## 2021-12-31 NOTE — Discharge Instructions (Signed)
Please read and follow all provided instructions. ?Home care instructions: -- *PRICE in the first 24-48 hours after injury: ?Protect with splint ?Rest ?Ice- Do not apply ice pack directly to your splint place towel or similar between your splint and ice/ice pack. Apply ice for 20 min, then remove for 40 min while awake ?Compression- splint ?Elevate affected extremity above the level of your heart when not walking around for the first 24-48 hours  ? ?Medications:  ?- Naproxen- this is a nonsteroidal anti-inflammatory medication that will help with pain and swelling. Be sure to take this medication as prescribed with food, 1 pill every 12 hours,  It should be taken with food, as it can cause stomach upset, and more seriously, stomach bleeding. Do not take other nonsteroidal anti-inflammatory medications with this such as Advil, Motrin, Aleve, Mobic, Goodie Powder, or Motrin etc..   ? ?-Percocet-this is a narcotic/controlled substance medication that has potential addicting qualities.  We recommend that you take 1-2 tablets every 6 hours as needed for severe pain.  Do not drive or operate heavy machinery when taking this medicine as it can be sedating. Do not drink alcohol or take other sedating medications when taking this medicine for safety reasons.  Keep this out of reach of small children.  Please be aware this medicine has Tylenol in it (325 mg/tab) do not exceed the maximum dose of Tylenol in a day per over the counter recommendations should you decide to supplement with Tylenol over the counter.  ? ?Zofran: Take every hours as needed for nausea and vomiting ? ?Pepcid: Take every 12 hours as needed for abdominal pain. ? ?We have prescribed you new medication(s) today. Discuss the medications prescribed today with your pharmacist as they can have adverse effects and interactions with your other medicines including over the counter and prescribed medications. Seek medical evaluation if you start to experience new  or abnormal symptoms after taking one of these medicines, seek care immediately if you start to experience difficulty breathing, feeling of your throat closing, facial swelling, or rash as these could be indications of a more serious allergic reaction ? ? ?Follow-up instructions: ?Please follow-up with the orthopedic surgeon in your discharge instructions as soon as possible.  ? ?Return instructions:  ?Please return if your digits or extremity are numb or tingling, appear gray or blue, or you have severe pain (also elevate the extremity and loosen splint or wrap if you were given one) ?Please return if you have redness or fevers.  ?Please return to the Emergency Department if you experience worsening symptoms.  ?Please return if you have any other emergent concerns. ?Additional Information: ? ?Your vital signs today were: ?BP 109/70   Pulse (!) 55   Temp 98 ?F (36.7 ?C) (Oral)   Resp 16   SpO2 100%  ?If your blood pressure (BP) was elevated above 135/85 this visit, please have this repeated by your doctor within one month. ?---------------  ? ?

## 2022-01-14 ENCOUNTER — Other Ambulatory Visit: Payer: Self-pay

## 2022-01-14 ENCOUNTER — Ambulatory Visit (INDEPENDENT_AMBULATORY_CARE_PROVIDER_SITE_OTHER): Payer: 59 | Admitting: Orthopaedic Surgery

## 2022-01-14 DIAGNOSIS — S92101A Unspecified fracture of right talus, initial encounter for closed fracture: Secondary | ICD-10-CM | POA: Diagnosis not present

## 2022-01-14 DIAGNOSIS — S92109A Unspecified fracture of unspecified talus, initial encounter for closed fracture: Secondary | ICD-10-CM

## 2022-01-14 NOTE — Progress Notes (Signed)
? ?                            ? ? ?Chief Complaint: Right ankle pain ?  ? ? ?History of Present Illness:  ? ? ?Valerie [name] is a 36 y.o. female presents today for follow-up after a fall 15 feet off a ladder.  She was washing her windows at her house.  She initially presented to the emergency room and had a work-up.  At this time her face was sutured right over the eyebrow she was found to have a fracture of the lateral process of the talus.  She is placed in a short cam boot and made weightbearing as tolerated.  She has since that time been having pain and soreness about the lateral aspect of the ankle.  Is been approximately 2 weeks since she presented to the emergency room.  She is here today for further assessment.  She has been using a cane in her right hand.  This helped significantly.  She is not currently working. ? ? ? ?Surgical History:   ?None ? ?PMH/PSH/Family History/Social History/Meds/Allergies:   ? ?Past Medical History:  ?Diagnosis Date  ? Asthma   ? PONV (postoperative nausea and vomiting)   ? ?Past Surgical History:  ?Procedure Laterality Date  ? CESAREAN SECTION    ? CESAREAN SECTION WITH BILATERAL TUBAL LIGATION Bilateral 08/29/2016  ? Procedure: CESAREAN SECTION WITH BILATERAL TUBAL LIGATION;  Surgeon: Adam Phenix, MD;  Location: [facility];  Service: Obstetrics;  Laterality: Bilateral;  ? CHOLECYSTECTOMY N/A 06/23/2017  ? Procedure: LAPAROSCOPIC CHOLECYSTECTOMY;  Surgeon: Violeta Gelinas, MD;  Location: [facility] OR;  Service: General;  Laterality: N/A;  ? OVARIAN CYST REMOVAL    ? ?Social History  ? ?Socioeconomic History  ? Marital status: Married  ?  Spouse name: Not on file  ? Number of children: Not on file  ? Years of education: Not on file  ? Highest education level: Not on file  ?Occupational History  ? Not on file  ?Tobacco Use  ? Smoking status: Never  ? Smokeless tobacco: Never  ?Substance and Sexual Activity  ? Alcohol use: No  ? Drug use: No  ? Sexual activity: Yes  ?Other  Topics Concern  ? Not on file  ?Social History Narrative  ? Not on file  ? ?Social Determinants of Health  ? ?Financial Resource Strain: Not on file  ?Food Insecurity: Not on file  ?Transportation Needs: Not on file  ?Physical Activity: Not on file  ?Stress: Not on file  ?Social Connections: Not on file  ? ?No family history on file. ?No Known Allergies ?Current Outpatient Medications  ?Medication Sig Dispense Refill  ? albuterol (PROAIR HFA) 108 (90 Base) MCG/ACT inhaler Inhale 1-2 puffs into the lungs every 6 (six) hours as needed for wheezing or shortness of breath. 1 Inhaler 0  ? famotidine (PEPCID) 20 MG tablet Take 1 tablet (20 mg total) by mouth 2 (two) times daily as needed for heartburn or indigestion (abdominal pain). 10 tablet 0  ? ibuprofen (ADVIL) 600 MG tablet Take 1 tablet (600 mg total) by mouth every 6 (six) hours as needed. 30 tablet 0  ? naproxen (EC-NAPROSYN) 500 MG EC tablet Take 1 tablet (500 mg total) by mouth 2 (two) times daily as needed (pain). 15 tablet 0  ? ondansetron (ZOFRAN-ODT) 4 MG disintegrating tablet Take 1 tablet (4 mg total) by mouth every 8 (eight)  hours as needed for nausea or vomiting. 8 tablet 0  ? oxyCODONE-acetaminophen (PERCOCET/ROXICET) 5-325 MG tablet Take 1-2 tablets by mouth every 6 (six) hours as needed for severe pain. 15 tablet 0  ? predniSONE (STERAPRED UNI-PAK 21 TAB) 10 MG (21) TBPK tablet As directed 21 tablet 0  ? tiZANidine (ZANAFLEX) 4 MG capsule Take 1 capsule (4 mg total) by mouth 3 (three) times daily as needed for muscle spasms. 21 capsule 0  ? ?No current facility-administered medications for this visit.  ? ?No results found. ? ?Review of Systems:   ?A ROS was performed including pertinent positives and negatives as documented in the HPI. ? ?Physical Exam :   ?Constitutional: NAD and appears stated age ?Neurological: Alert and oriented ?Psych: Appropriate affect and cooperative ?unknown if currently breastfeeding.  ? ?Comprehensive Musculoskeletal Exam:    ? ?There is tenderness and swelling about the lateral talus and the ATFL.  A talar tilt and anterior drawer test was deferred today given the fact that she does have an avulsion type fracture.  There is swelling about the ankle.  She walks with an antalgic gait with a cane.  Tenderness about the lateral IT band on the right.  Otherwise sensory neuro exam is intact in the right lower extremity ? ?Imaging:   ?Xray (3 views right ankle, 2 views right foot, 3 views right knee): ?Fracture minimally displaced of the lateral process of the talus ? ? ?I personally reviewed and interpreted the radiographs. ? ? ?Assessment:   ?36 year old female with a fracture of the lateral process of the talus consistent with an ATFL type avulsion.  I discussed these typically tend to act like ankle sprains.  At this point it has been 2 weeks and she has been somewhat reserved in terms of advancing her motion.  At this time I would like her to wean out of her cam boot as she can tolerate.  I have given her compressive Ace wrap and instructed her how to use these.  I would also like her to ice and elevate the leg.  I will like her to wean off of her cane as tolerated.  I will see her back in 4 weeks for reassessment.   ? ?Plan :   ? ?-Return to clinic in 4 weeks for ressessment ? ? ? ? ?I personally saw and evaluated the patient, and participated in the management and treatment plan. ? ?Huel Cote, MD ?Attending Physician, Orthopedic Surgery ? ?This document was dictated using Conservation officer, historic buildings. A reasonable attempt at proof reading has been made to minimize errors. ?

## 2022-02-18 ENCOUNTER — Other Ambulatory Visit (HOSPITAL_BASED_OUTPATIENT_CLINIC_OR_DEPARTMENT_OTHER): Payer: Self-pay | Admitting: Orthopaedic Surgery

## 2022-02-18 ENCOUNTER — Ambulatory Visit (HOSPITAL_BASED_OUTPATIENT_CLINIC_OR_DEPARTMENT_OTHER)
Admission: RE | Admit: 2022-02-18 | Discharge: 2022-02-18 | Disposition: A | Discharge status: Home / Self Care | Payer: 59 | Source: Ambulatory Visit | Attending: Orthopaedic Surgery | Admitting: Orthopaedic Surgery

## 2022-02-18 ENCOUNTER — Ambulatory Visit (HOSPITAL_BASED_OUTPATIENT_CLINIC_OR_DEPARTMENT_OTHER): Payer: 59 | Admitting: Orthopaedic Surgery

## 2022-02-18 DIAGNOSIS — S92109A Unspecified fracture of unspecified talus, initial encounter for closed fracture: Secondary | ICD-10-CM

## 2022-02-18 DIAGNOSIS — M25572 Pain in left ankle and joints of left foot: Secondary | ICD-10-CM | POA: Diagnosis not present

## 2022-02-18 DIAGNOSIS — S92422A Displaced fracture of distal phalanx of left great toe, initial encounter for closed fracture: Secondary | ICD-10-CM | POA: Diagnosis not present

## 2022-02-18 DIAGNOSIS — W11XXXA Fall on and from ladder, initial encounter: Secondary | ICD-10-CM | POA: Insufficient documentation

## 2022-02-18 DIAGNOSIS — S92144A Nondisplaced dome fracture of right talus, initial encounter for closed fracture: Secondary | ICD-10-CM | POA: Insufficient documentation

## 2022-02-18 DIAGNOSIS — M7731 Calcaneal spur, right foot: Secondary | ICD-10-CM | POA: Insufficient documentation

## 2022-02-18 DIAGNOSIS — S92101A Unspecified fracture of right talus, initial encounter for closed fracture: Secondary | ICD-10-CM

## 2022-02-18 DIAGNOSIS — M7732 Calcaneal spur, left foot: Secondary | ICD-10-CM | POA: Diagnosis not present

## 2022-02-18 NOTE — Progress Notes (Signed)
? ?                            ? ? ?Chief Complaint: Right ankle pain and left foot pain ?  ? ? ?History of Present Illness:  ? ?02/18/2022: Patient presents today for follow-up of her right ankle sprain as well as left foot.  Overall the left foot has been feeling dramatically better.  She does continue to have swelling particularly and she has been walking more on the ankle.  She is weightbearing as tolerated in a normal shoe on the right ankle.  She is slowly improving although somewhat frustrated about the ankle ? ?Valerie [name] is a 36 y.o. female presents today for follow-up after a fall 15 feet off a ladder.  She was washing her windows at her house.  She initially presented to the emergency room and had a work-up.  At this time her face was sutured right over the eyebrow she was found to have a fracture of the lateral process of the talus.  She is placed in a short cam boot and made weightbearing as tolerated.  She has since that time been having pain and soreness about the lateral aspect of the ankle.  Is been approximately 2 weeks since she presented to the emergency room.  She is here today for further assessment.  She has been using a cane in her right hand.  This helped significantly.  She is not currently working. ? ? ? ?Surgical History:   ?None ? ?PMH/PSH/Family History/Social History/Meds/Allergies:   ? ?Past Medical History:  ?Diagnosis Date  ? Asthma   ? PONV (postoperative nausea and vomiting)   ? ?Past Surgical History:  ?Procedure Laterality Date  ? CESAREAN SECTION    ? CESAREAN SECTION WITH BILATERAL TUBAL LIGATION Bilateral 08/29/2016  ? Procedure: CESAREAN SECTION WITH BILATERAL TUBAL LIGATION;  Surgeon: Woodroe Mode, MD;  Location: [place];  Service: Obstetrics;  Laterality: Bilateral;  ? CHOLECYSTECTOMY N/A 06/23/2017  ? Procedure: LAPAROSCOPIC CHOLECYSTECTOMY;  Surgeon: Georganna Skeans, MD;  Location: [place];  Service: General;  Laterality: N/A;  ? OVARIAN CYST REMOVAL     ? ?Social History  ? ?Socioeconomic History  ? Marital status: Married  ?  Spouse name: Not on file  ? Number of children: Not on file  ? Years of education: Not on file  ? Highest education level: Not on file  ?Occupational History  ? Not on file  ?Tobacco Use  ? Smoking status: Never  ? Smokeless tobacco: Never  ?Substance and Sexual Activity  ? Alcohol use: No  ? Drug use: No  ? Sexual activity: Yes  ?Other Topics Concern  ? Not on file  ?Social History Narrative  ? Not on file  ? ?Social Determinants of Health  ? ?Financial Resource Strain: Not on file  ?Food Insecurity: Not on file  ?Transportation Needs: Not on file  ?Physical Activity: Not on file  ?Stress: Not on file  ?Social Connections: Not on file  ? ?No family history on file. ?No Known Allergies ?Current Outpatient Medications  ?Medication Sig Dispense Refill  ? albuterol (PROAIR HFA) 108 (90 Base) MCG/ACT inhaler Inhale 1-2 puffs into the lungs every 6 (six) hours as needed for wheezing or shortness of breath. 1 Inhaler 0  ? famotidine (PEPCID) 20 MG tablet Take 1 tablet (20 mg total) by mouth 2 (two) times daily as needed for heartburn or indigestion (abdominal pain).  10 tablet 0  ? ibuprofen (ADVIL) 600 MG tablet Take 1 tablet (600 mg total) by mouth every 6 (six) hours as needed. 30 tablet 0  ? naproxen (EC-NAPROSYN) 500 MG EC tablet Take 1 tablet (500 mg total) by mouth 2 (two) times daily as needed (pain). 15 tablet 0  ? ondansetron (ZOFRAN-ODT) 4 MG disintegrating tablet Take 1 tablet (4 mg total) by mouth every 8 (eight) hours as needed for nausea or vomiting. 8 tablet 0  ? oxyCODONE-acetaminophen (PERCOCET/ROXICET) 5-325 MG tablet Take 1-2 tablets by mouth every 6 (six) hours as needed for severe pain. 15 tablet 0  ? predniSONE (STERAPRED UNI-PAK 21 TAB) 10 MG (21) TBPK tablet As directed 21 tablet 0  ? tiZANidine (ZANAFLEX) 4 MG capsule Take 1 capsule (4 mg total) by mouth 3 (three) times daily as needed for muscle spasms. 21 capsule 0   ? ?No current facility-administered medications for this visit.  ? ?No results found. ? ?Review of Systems:   ?A ROS was performed including pertinent positives and negatives as documented in the HPI. ? ?Physical Exam :   ?Constitutional: NAD and appears stated age ?Neurological: Alert and oriented ?Psych: Appropriate affect and cooperative ?unknown if currently breastfeeding.  ? ?Comprehensive Musculoskeletal Exam:   ? ?There is tenderness and swelling about the lateral talus and the ATFL.  A talar tilt and anterior drawer test was deferred today given the fact that she does have an avulsion type fracture.  Walks with a nonantalgic gait.  She has no tenderness with full range of motion about the left foot.  Sensation is intact in both feet.  2+ dorsalis pedis bilaterally ? ?Imaging:   ?Xray (3 views right ankle, 2 views right foot, left foot 3 views): ?Fracture minimally displaced of the lateral process of the talus, normal left foot ? ? ?I personally reviewed and interpreted the radiographs. ? ? ?Assessment:   ?36 year old female with a fracture of the lateral process of the talus consistent with an ATFL type avulsion.  She is continuing to make slow improvement.  She will continue to weight-bear as tolerated on the right ankle.  She will see me back on an as-needed basis. ? ?Plan :   ? ?-Return to clinic as needed ? ? ? ? ?I personally saw and evaluated the patient, and participated in the management and treatment plan. ? ?Vanetta Mulders, MD ?Attending Physician, Orthopedic Surgery ? ?This document was dictated using Systems analyst. A reasonable attempt at proof reading has been made to minimize errors. ?

## 2022-08-14 ENCOUNTER — Ambulatory Visit (INDEPENDENT_AMBULATORY_CARE_PROVIDER_SITE_OTHER): Payer: Commercial Managed Care - HMO

## 2022-08-14 ENCOUNTER — Other Ambulatory Visit (HOSPITAL_BASED_OUTPATIENT_CLINIC_OR_DEPARTMENT_OTHER): Payer: Self-pay

## 2022-08-14 ENCOUNTER — Ambulatory Visit (HOSPITAL_BASED_OUTPATIENT_CLINIC_OR_DEPARTMENT_OTHER): Payer: Commercial Managed Care - HMO | Admitting: Orthopaedic Surgery

## 2022-08-14 DIAGNOSIS — S92109A Unspecified fracture of unspecified talus, initial encounter for closed fracture: Secondary | ICD-10-CM | POA: Diagnosis not present

## 2022-08-14 MED ORDER — METHYLPREDNISOLONE 4 MG PO TBPK
ORAL_TABLET | ORAL | 0 refills | Status: DC
  Filled 2022-08-14: qty 21, 6d supply, fill #0

## 2022-08-14 NOTE — Progress Notes (Signed)
Chief Complaint: Right ankle pain and left foot pain     History of Present Illness:   08/14/2022: Presents today for follow-up of her right ankle.  She is experiencing soreness and sharp shooting pain that is going down the hip and thigh into the leg as well as numbness on the top of the ankle.  Overall she is is continue to have ankle pain and is having a hard time walking on the ankle.  She is here today for further assessment.  Valerie [name] is a 36 y.o. female presents today for follow-up after a fall 15 feet off a ladder.  She was washing her windows at her house.  She initially presented to the emergency room and had a work-up.  At this time her face was sutured right over the eyebrow she was found to have a fracture of the lateral process of the talus.  She is placed in a short cam boot and made weightbearing as tolerated.  She has since that time been having pain and soreness about the lateral aspect of the ankle.  Is been approximately 2 weeks since she presented to the emergency room.  She is here today for further assessment.  She has been using a cane in her right hand.  This helped significantly.  She is not currently working.    Surgical History:   None  PMH/PSH/Family History/Social History/Meds/Allergies:    Past Medical History:  Diagnosis Date   Asthma    PONV (postoperative nausea and vomiting)    Past Surgical History:  Procedure Laterality Date   CESAREAN SECTION     CESAREAN SECTION WITH BILATERAL TUBAL LIGATION Bilateral 08/29/2016   Procedure: CESAREAN SECTION WITH BILATERAL TUBAL LIGATION;  Surgeon: Woodroe Mode, MD;  Location: [place];  Service: Obstetrics;  Laterality: Bilateral;   CHOLECYSTECTOMY N/A 06/23/2017   Procedure: LAPAROSCOPIC CHOLECYSTECTOMY;  Surgeon: Georganna Skeans, MD;  Location: [place];  Service: General;  Laterality: N/A;   OVARIAN CYST REMOVAL     Social History   Socioeconomic History    Marital status: Married    Spouse name: Not on file   Number of children: Not on file   Years of education: Not on file   Highest education level: Not on file  Occupational History   Not on file  Tobacco Use   Smoking status: Never   Smokeless tobacco: Never  Substance and Sexual Activity   Alcohol use: No   Drug use: No   Sexual activity: Yes  Other Topics Concern   Not on file  Social History Narrative   Not on file   Social Determinants of Health   Financial Resource Strain: Not on file  Food Insecurity: Not on file  Transportation Needs: Not on file  Physical Activity: Not on file  Stress: Not on file  Social Connections: Not on file   No family history on file. No Known Allergies Current Outpatient Medications  Medication Sig Dispense Refill   albuterol (PROAIR HFA) 108 (90 Base) MCG/ACT inhaler Inhale 1-2 puffs into the lungs every 6 (six) hours as needed for wheezing or shortness of breath. 1 Inhaler 0   famotidine (PEPCID) 20 MG tablet Take 1 tablet (20 mg total) by mouth 2 (two) times daily as needed for heartburn or indigestion (abdominal pain). 10  tablet 0   ibuprofen (ADVIL) 600 MG tablet Take 1 tablet (600 mg total) by mouth every 6 (six) hours as needed. 30 tablet 0   naproxen (EC-NAPROSYN) 500 MG EC tablet Take 1 tablet (500 mg total) by mouth 2 (two) times daily as needed (pain). 15 tablet 0   ondansetron (ZOFRAN-ODT) 4 MG disintegrating tablet Take 1 tablet (4 mg total) by mouth every 8 (eight) hours as needed for nausea or vomiting. 8 tablet 0   oxyCODONE-acetaminophen (PERCOCET/ROXICET) 5-325 MG tablet Take 1-2 tablets by mouth every 6 (six) hours as needed for severe pain. 15 tablet 0   predniSONE (STERAPRED UNI-PAK 21 TAB) 10 MG (21) TBPK tablet As directed 21 tablet 0   tiZANidine (ZANAFLEX) 4 MG capsule Take 1 capsule (4 mg total) by mouth 3 (three) times daily as needed for muscle spasms. 21 capsule 0   No current facility-administered medications  for this visit.   No results found.  Review of Systems:   A ROS was performed including pertinent positives and negatives as documented in the HPI.  Physical Exam :   Constitutional: NAD and appears stated age Neurological: Alert and oriented Psych: Appropriate affect and cooperative unknown if currently breastfeeding.   Comprehensive Musculoskeletal Exam:    There is tenderness and swelling about the lateral talus and the ATFL.  A talar tilt and anterior drawer test was deferred today given the fact that she does have an avulsion type fracture.  Walks with a nonantalgic gait.  She has no tenderness with full range of motion about the left foot.  Sensation is intact in both feet.  2+ dorsalis pedis bilaterally  Imaging:   Xray (3 views right ankle, 2 views right foot, left foot 3 views): Fracture minimally displaced of the lateral process of the talus, normal left foot   I personally reviewed and interpreted the radiographs.   Assessment:   36 year old female with a fracture of the lateral process of the talus consistent with an ATFL type avulsion.  This is subsequently healed on x-rays today.  She is having some radiating pain down the lower back that I do believe is consistent with radiculopathic pain.  I have offered her an ultrasound-guided injection of the right ankle so we can hopefully get her some pain relief and hopefully diagnose if her ankle is a true pain generator versus her lower back.  After sent a Medrol Dosepak as well for her lower radicular type pain.  We will begin physical therapy as well for her back and ankle strengthening  Plan :    -Return to clinic as needed     I personally saw and evaluated the patient, and participated in the management and treatment plan.  Huel Cote, MD Attending Physician, Orthopedic Surgery  This document was dictated using Dragon voice recognition software. A reasonable attempt at proof reading has been made to minimize  errors.

## 2023-01-15 ENCOUNTER — Emergency Department (HOSPITAL_COMMUNITY)
Admission: EM | Admit: 2023-01-15 | Discharge: 2023-01-15 | Disposition: A | Discharge status: Home / Self Care | Payer: 59 | Attending: Emergency Medicine | Admitting: Emergency Medicine

## 2023-01-15 ENCOUNTER — Other Ambulatory Visit: Payer: Self-pay

## 2023-01-15 ENCOUNTER — Emergency Department (HOSPITAL_COMMUNITY): Payer: 59

## 2023-01-15 ENCOUNTER — Encounter (HOSPITAL_COMMUNITY): Payer: Self-pay

## 2023-01-15 DIAGNOSIS — M25571 Pain in right ankle and joints of right foot: Secondary | ICD-10-CM | POA: Insufficient documentation

## 2023-01-15 MED ORDER — NAPROXEN 500 MG PO TABS: 500.0000 mg | ORAL_TABLET | Freq: Two times a day (BID) | ORAL | 0 refills | Status: AC

## 2023-01-15 NOTE — Progress Notes (Signed)
Orthopedic Tech Progress Note Patient Details:  Valerie [name] July 09, 1986 [id number]  Ortho Devices Type of Ortho Device: ASO, Crutches Ortho Device/Splint Location: Right ankle Ortho Device/Splint Interventions: Application, Adjustment   Post Interventions Patient Tolerated: Well Instructions Provided: Adjustment of device  [name] [name] 01/15/2023, 6:43 PM

## 2023-01-15 NOTE — Discharge Instructions (Addendum)
Le he recetado medicina para ayudar con Conservation officer, historic buildings. Tome una [employer] veces al dia con comida.   Haga una cita con el ortopedista Dr. Victorino December para el seguimiento de su dolor de tobillo derecho.

## 2023-01-15 NOTE — ED Triage Notes (Signed)
Pt came in via POV d/t Rt foot/ankle pain cannot bear weight on it d/t sever pain. Pt reports 1 yr ago she injured it & then last night it became as swollen as it is right now so she came in to get it checked out. Rates pain 10/10 while in triage.

## 2023-01-15 NOTE — ED Provider Notes (Signed)
[facility] Provider Note   CSN: [id number] Arrival date & time: 01/15/23  1422     History  Chief Complaint  Patient presents with   Rt Ankle Pain/Injury    Brylan Febles is a 37 y.o. female.  37 y.o female with a PMH right ankle injury presents to the ED with a chief complaint of right ankle pain x 1 year. Patient reports falling off a ladder approximately 1 year ago, states that she has continued to have residual pain to her right ankle along with swelling.  Now reports the swelling has worsened along with the pain.  She reports that over the last couple of days she has not been able to weight-bear, feels that her ankle is very swollen, exacerbated with any weightbearing, no alleviating factors.  She has taken 1 Advil which did not help with any of the symptoms.  No fevers, no other falls, no other complaints reported.  The history is provided by the patient.       Home Medications Prior to Admission medications   Medication Sig Start Date End Date Taking? Authorizing Provider  naproxen (NAPROSYN) 500 MG tablet Take 1 tablet (500 mg total) by mouth 2 (two) times daily for 7 days. 01/15/23 01/22/23 Yes Buna Cuppett, Beverley Fiedler, PA-C  albuterol (PROAIR HFA) 108 (90 Base) MCG/ACT inhaler Inhale 1-2 puffs into the lungs every 6 (six) hours as needed for wheezing or shortness of breath. 09/07/18   Zigmund Gottron, NP  famotidine (PEPCID) 20 MG tablet Take 1 tablet (20 mg total) by mouth 2 (two) times daily as needed for heartburn or indigestion (abdominal pain). 12/31/21   Petrucelli, Samantha R, PA-C  ibuprofen (ADVIL) 600 MG tablet Take 1 tablet (600 mg total) by mouth every 6 (six) hours as needed. 01/08/20   Domenic Moras, PA-C  methylPREDNISolone (MEDROL DOSEPAK) 4 MG TBPK tablet Take per packet instructions 08/14/22   Vanetta Mulders, MD  ondansetron (ZOFRAN-ODT) 4 MG disintegrating tablet Take 1 tablet (4 mg total) by mouth every 8 (eight) hours as  needed for nausea or vomiting. 12/31/21   Petrucelli, Glynda Jaeger, PA-C  oxyCODONE-acetaminophen (PERCOCET/ROXICET) 5-325 MG tablet Take 1-2 tablets by mouth every 6 (six) hours as needed for severe pain. 12/31/21   Petrucelli, Samantha R, PA-C  predniSONE (STERAPRED UNI-PAK 21 TAB) 10 MG (21) TBPK tablet As directed 09/03/21   Raspet, Erin K, PA-C  tiZANidine (ZANAFLEX) 4 MG capsule Take 1 capsule (4 mg total) by mouth 3 (three) times daily as needed for muscle spasms. 09/03/21   Raspet, Derry Skill, PA-C      Allergies    Patient has no known allergies.    Review of Systems   Review of Systems  Constitutional:  Negative for fever.  Musculoskeletal:  Positive for arthralgias. Negative for back pain.  All other systems reviewed and are negative.   Physical Exam Updated Vital Signs BP 130/79 (BP Location: Right Arm)   Pulse 69   Temp (!) 97.4 F (36.3 C) (Oral)   Resp 18   Ht 5\' 6"  (1.676 m)   SpO2 100%   BMI 33.73 kg/m  Physical Exam Vitals and nursing note reviewed.  Constitutional:      Appearance: Normal appearance.  HENT:     Head: Normocephalic and atraumatic.     Mouth/Throat:     Mouth: Mucous membranes are moist.  Cardiovascular:     Rate and Rhythm: Normal rate.  Pulmonary:  Effort: Pulmonary effort is normal.  Abdominal:     General: Abdomen is flat.  Musculoskeletal:        General: Tenderness present.     Cervical back: Normal range of motion and neck supple.     Right ankle: Swelling present. No deformity, ecchymosis or lacerations. Tenderness present over the lateral malleolus. Decreased range of motion.     Comments: Decrease ROM due to pain, 2+ DP,PT pulses present, capillary refill is intact.   Skin:    General: Skin is warm and dry.  Neurological:     Mental Status: She is alert and oriented to person, place, and time.     ED Results / Procedures / Treatments   Labs (all labs ordered are listed, but only abnormal results are displayed) Labs  Reviewed - No data to display  EKG None  Radiology DG Ankle Complete Right  Result Date: 01/15/2023 CLINICAL DATA:  Pain and swelling EXAM: RIGHT ANKLE - COMPLETE 3+ VIEW COMPARISON:  08/14/2022 FINDINGS: There is a mild cortical irregularity in the lateral aspect of the talus inferior to the tip of lateral malleolus. There is small linear smooth marginated calcification between the tip of lateral malleolus and the talus. Findings suggest possible old fracture in talus. No definite recent displaced fracture or dislocation is seen. Ankle mortise is unremarkable. Small bony spurs are seen in the anterior distal tibia. Small plantar spur is seen in calcaneus. There is soft tissue swelling over the lateral malleolus. IMPRESSION: Deformity with adjacent smooth marginated calcification in the lateral aspect of talus may be residual from previous injury. Possibility of re-injury at the site of old fracture is not excluded. No recent displaced fracture or dislocation is seen. Electronically Signed   By: Elmer Picker M.D.   On: 01/15/2023 15:16    Procedures Procedures    Medications Ordered in ED Medications - No data to display  ED Course/ Medical Decision Making/ A&P                             Medical Decision Making   Patient with prior history of right ankle injury presents to the ED with a chief complaint of right ankle pain which began a couple days ago.  Right ankle has been swollen over the past year, however worsening pain over the last 48 hours.  Patient reports that she feels like she likely reinjured, her fall a year ago was from a ladder about 30 feet, she reports she did not follow-up with orthopedist after this.  On today's visit she does have a reassuring exam 2+ DP, PT pulses.  Sensation is intact throughout, capillary refills intact.  Decreased range of motion due to pain.  We discussed x-ray findings, acute on chronic injury, I do feel that she needs orthopedist evaluation  as this is a reinjury, she will go home on a ankle brace , chest to help with weightbearing as she is ambulating currently with a cane.  I do feel that she likely needs further management with specialist.  Patient is hemodynamically stable for discharge.  Portions of this note were generated with [employer]. Dictation errors may occur despite best attempts at proofreading.   Final Clinical Impression(s) / ED Diagnoses Final diagnoses:  Acute right ankle pain    Rx / DC Orders ED Discharge Orders          Ordered    naproxen (NAPROSYN) 500 MG tablet  2 times daily        01/15/23 [place], Jostin Rue, PA-C 01/15/23 [place], DO 01/15/23 1720

## 2023-03-17 DIAGNOSIS — M25571 Pain in right ankle and joints of right foot: Secondary | ICD-10-CM | POA: Insufficient documentation

## 2023-03-17 NOTE — Progress Notes (Signed)
   Office Visit Note   Patient: Valerie [name]           Date of Birth: 1986-03-09           MRN: [id number] Visit Date: 03/18/2023              Requested by: No referring provider defined for this encounter. PCP: Pcp, No   Assessment & Plan: Visit Diagnoses: No diagnosis found.  Plan: ***  Follow-Up Instructions: No follow-ups on file.   Orders:  No orders of the defined types were placed in this encounter.  No orders of the defined types were placed in this encounter.     Procedures: No procedures performed   Clinical Data: No additional findings.   Subjective: No chief complaint on file.   HPI  Review of Systems  Constitutional: Negative.   HENT: Negative.    Eyes: Negative.   Respiratory: Negative.    Cardiovascular: Negative.   Endocrine: Negative.   Musculoskeletal: Negative.   Neurological: Negative.   Hematological: Negative.   Psychiatric/Behavioral: Negative.    All other systems reviewed and are negative.    Objective: Vital Signs: There were no vitals taken for this visit.  Physical Exam Vitals and nursing note reviewed.  Constitutional:      Appearance: She is well-developed.  HENT:     Head: Atraumatic.     Nose: Nose normal.  Eyes:     Extraocular Movements: Extraocular movements intact.  Cardiovascular:     Pulses: Normal pulses.  Pulmonary:     Effort: Pulmonary effort is normal.  Abdominal:     Palpations: Abdomen is soft.  Musculoskeletal:     Cervical back: Neck supple.  Skin:    General: Skin is warm.     Capillary Refill: Capillary refill takes less than 2 seconds.  Neurological:     Mental Status: She is alert. Mental status is at baseline.  Psychiatric:        Behavior: Behavior normal.        Thought Content: Thought content normal.        Judgment: Judgment normal.     Ortho Exam  Specialty Comments:  No specialty comments available.  Imaging: No results found.   PMFS History: Patient Active  Problem List   Diagnosis Date Noted   DUB (dysfunctional uterine bleeding) 11/17/2017   Anemia due to blood loss, chronic 11/17/2017   History of unexplained stillbirth 08/22/2016   History of 2 cesarean sections 04/29/2016   Past Medical History:  Diagnosis Date   Asthma    PONV (postoperative nausea and vomiting)     No family history on file.  Past Surgical History:  Procedure Laterality Date   CESAREAN SECTION     CESAREAN SECTION WITH BILATERAL TUBAL LIGATION Bilateral 08/29/2016   Procedure: CESAREAN SECTION WITH BILATERAL TUBAL LIGATION;  Surgeon: Adam Phenix, MD;  Location: [facility] BIRTHING SUITES;  Service: Obstetrics;  Laterality: Bilateral;   CHOLECYSTECTOMY N/A 06/23/2017   Procedure: LAPAROSCOPIC CHOLECYSTECTOMY;  Surgeon: Violeta Gelinas, MD;  Location: [facility] OR;  Service: General;  Laterality: N/A;   OVARIAN CYST REMOVAL     Social History   Occupational History   Not on file  Tobacco Use   Smoking status: Never   Smokeless tobacco: Never  Substance and Sexual Activity   Alcohol use: No   Drug use: No   Sexual activity: Yes

## 2023-03-18 ENCOUNTER — Ambulatory Visit (INDEPENDENT_AMBULATORY_CARE_PROVIDER_SITE_OTHER): Payer: 59 | Admitting: Orthopaedic Surgery

## 2023-03-18 DIAGNOSIS — M25571 Pain in right ankle and joints of right foot: Secondary | ICD-10-CM | POA: Diagnosis not present

## 2023-04-08 ENCOUNTER — Ambulatory Visit (INDEPENDENT_AMBULATORY_CARE_PROVIDER_SITE_OTHER): Payer: 59 | Admitting: Orthopaedic Surgery

## 2023-04-08 ENCOUNTER — Other Ambulatory Visit (INDEPENDENT_AMBULATORY_CARE_PROVIDER_SITE_OTHER): Payer: 59

## 2023-04-08 DIAGNOSIS — M25562 Pain in left knee: Secondary | ICD-10-CM | POA: Diagnosis not present

## 2023-04-08 DIAGNOSIS — M25571 Pain in right ankle and joints of right foot: Secondary | ICD-10-CM | POA: Diagnosis not present

## 2023-04-08 NOTE — Progress Notes (Signed)
Office Visit Note   Patient: Valerie [name]           Date of Birth: [birth year]           MRN: [id number] Visit Date: 04/08/2023              Requested by: No referring provider defined for this encounter. PCP: Pcp, No   Assessment & Plan: Visit Diagnoses:  1. Acute pain of left knee   2. Pain in right ankle and joints of right foot     Plan: Impression is chronic right ankle pain and new onset of left knee pain.  For the ankle we will order an MRI to rule out structural abnormalities.  For the left knee I think this is due to compensation for the ankle pain.  I recommend symptomatic treatment and focus on normalizing gait.  Follow-up after the MRI.  Follow-Up Instructions: No follow-ups on file.   Orders:  Orders Placed This Encounter  Procedures   XR KNEE 3 VIEW LEFT   No orders of the defined types were placed in this encounter.     Procedures: No procedures performed   Clinical Data: No additional findings.   Subjective: Chief Complaint  Patient presents with   Right Ankle - Follow-up   Left Knee - Pain   Other     Follow up-patient states she does not feel like PT is helping    HPI Patient comes back for continued right ankle and foot pain.  She was unable to go to physical therapy because she was never set up for this.  She states that her left knee has started to hurt since the right ankle was hurting. Review of Systems  Constitutional: Negative.   HENT: Negative.    Eyes: Negative.   Respiratory: Negative.    Cardiovascular: Negative.   Endocrine: Negative.   Musculoskeletal: Negative.   Neurological: Negative.   Hematological: Negative.   Psychiatric/Behavioral: Negative.    All other systems reviewed and are negative.    Objective: Vital Signs: There were no vitals taken for this visit.  Physical Exam Vitals and nursing note reviewed.  Constitutional:      Appearance: She is well-developed.  HENT:     Head: Normocephalic and  atraumatic.  Pulmonary:     Effort: Pulmonary effort is normal.  Abdominal:     Palpations: Abdomen is soft.  Musculoskeletal:     Cervical back: Neck supple.  Skin:    General: Skin is warm.     Capillary Refill: Capillary refill takes less than 2 seconds.  Neurological:     Mental Status: She is alert and oriented to person, place, and time.  Psychiatric:        Behavior: Behavior normal.        Thought Content: Thought content normal.        Judgment: Judgment normal.     Ortho Exam Examination right ankle is unchanged. Examination of the left knee shows slight tenderness to the medial joint line.  No other findings. Specialty Comments:  No specialty comments available.  Imaging: XR KNEE 3 VIEW LEFT  Result Date: 04/08/2023 No acute or structural abnormalities    PMFS History: Patient Active Problem List   Diagnosis Date Noted   Pain in right ankle and joints of right foot 03/17/2023   DUB (dysfunctional uterine bleeding) 11/17/2017   Anemia due to blood loss, chronic 11/17/2017   History of unexplained stillbirth 08/22/2016   History of 2  cesarean sections 04/29/2016   Past Medical History:  Diagnosis Date   Asthma    PONV (postoperative nausea and vomiting)     No family history on file.  Past Surgical History:  Procedure Laterality Date   CESAREAN SECTION     CESAREAN SECTION WITH BILATERAL TUBAL LIGATION Bilateral 08/29/2016   Procedure: CESAREAN SECTION WITH BILATERAL TUBAL LIGATION;  Surgeon: Adam Phenix, MD;  Location: [facility] BIRTHING SUITES;  Service: Obstetrics;  Laterality: Bilateral;   CHOLECYSTECTOMY N/A 06/23/2017   Procedure: LAPAROSCOPIC CHOLECYSTECTOMY;  Surgeon: Violeta Gelinas, MD;  Location: [facility] OR;  Service: General;  Laterality: N/A;   OVARIAN CYST REMOVAL     Social History   Occupational History   Not on file  Tobacco Use   Smoking status: Never   Smokeless tobacco: Never  Substance and Sexual Activity   Alcohol use: No   Drug use:  No   Sexual activity: Yes

## 2023-04-18 ENCOUNTER — Ambulatory Visit (HOSPITAL_COMMUNITY)
Admission: RE | Admit: 2023-04-18 | Discharge: 2023-04-18 | Disposition: A | Discharge status: Home / Self Care | Payer: 59 | Source: Ambulatory Visit | Attending: Orthopaedic Surgery | Admitting: Orthopaedic Surgery

## 2023-04-18 DIAGNOSIS — M25571 Pain in right ankle and joints of right foot: Secondary | ICD-10-CM | POA: Insufficient documentation

## 2023-04-28 NOTE — Progress Notes (Signed)
Office Visit Note   Patient: Valerie [name]           Date of Birth: Jul 25, 1986           MRN: [id number] Visit Date: 04/29/2023              Requested by: No referring provider defined for this encounter. PCP: Pcp, No   Assessment & Plan: Visit Diagnoses:  1. 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  Mild plantar fasciitis.  Her symptoms correspond to the subtalar joint.  I recommend a sinus Tarsi injection and immobilization with Cam boot.  If symptoms persist we will need to refer patient to Dr. Lajoyce Corners for further evaluation and treatment.  Arthritis panel obtained today.  Pain seems to be out of proportion with objective findings.  Follow-Up Instructions: No follow-ups on file.   Orders:  No orders of the defined types were placed in this encounter.  No orders of the defined types were placed in this encounter.     Procedures: Medium Joint Inj: R ankle on 04/29/2023 8:15 AM Indications: pain Details: 25 G needle Medications: 1 mL lidocaine 1 %; 40 mg methylPREDNISolone acetate 40 MG/ML; 1 mL bupivacaine 0.5 % Outcome: tolerated well, no immediate complications Patient was prepped and draped in the usual sterile fashion.       Clinical Data: No additional findings.   Subjective: Chief Complaint  Patient presents with   Right Ankle - Follow-up    MRI review    HPI Bartus returns today to discuss right ankle MRI scan.  Reports no changes in symptoms. Review of Systems  Constitutional: Negative.   HENT: Negative.    Eyes: Negative.   Respiratory: Negative.    Cardiovascular: Negative.   Endocrine: Negative.   Musculoskeletal: Negative.   Neurological: Negative.   Hematological: Negative.   Psychiatric/Behavioral: Negative.    All other systems reviewed and are negative.    Objective: Vital Signs: There were no vitals taken for this  visit.  Physical Exam Vitals and nursing note reviewed.  Constitutional:      Appearance: She is well-developed.  HENT:     Head: Normocephalic and atraumatic.  Pulmonary:     Effort: Pulmonary effort is normal.  Abdominal:     Palpations: Abdomen is soft.  Musculoskeletal:     Cervical back: Neck supple.  Skin:    General: Skin is warm.     Capillary Refill: Capillary refill takes less than 2 seconds.  Neurological:     Mental Status: She is alert and oriented to person, place, and time.  Psychiatric:        Behavior: Behavior normal.        Thought Content: Thought content normal.        Judgment: Judgment normal.     Ortho Exam Examination right ankle is unchanged.  Mild swelling diffusely.  Pain in the sinus Tarsi. Specialty Comments:  No specialty comments available.  Imaging: No results found.   PMFS History: Patient Active Problem List   Diagnosis Date Noted   Pain in right ankle and joints of right foot 03/17/2023   DUB (dysfunctional uterine bleeding) 11/17/2017   Anemia due to blood loss, chronic 11/17/2017   History of unexplained stillbirth 08/22/2016   History of 2 cesarean sections 04/29/2016   Past Medical History:  Diagnosis Date   Asthma  PONV (postoperative nausea and vomiting)     No family history on file.  Past Surgical History:  Procedure Laterality Date   CESAREAN SECTION     CESAREAN SECTION WITH BILATERAL TUBAL LIGATION Bilateral 08/29/2016   Procedure: CESAREAN SECTION WITH BILATERAL TUBAL LIGATION;  Surgeon: Adam Phenix, MD;  Location: [facility] BIRTHING SUIT[facility];  Service: Obstetrics;  Laterality: Bilateral;   CHOLECYSTECTOMY N/A 06/23/2017   Procedure: LAPAROSCOPIC CHOLECYSTECTOMY;  Surgeon: Violeta Gelinas, MD;  Location: [facility] OR;  Service: General;  Laterality: N/A;   OVARIAN CYST REMOVAL     Social History   Occupational History   Not on file  Tobacco Use   Smoking status: Never   Smokeless tobacco: Never  Substance and Sexual  Activity   Alcohol use: No   Drug use: No   Sexual activity: Yes

## 2023-04-29 ENCOUNTER — Encounter: Payer: Self-pay | Admitting: Orthopaedic Surgery

## 2023-04-29 ENCOUNTER — Ambulatory Visit (INDEPENDENT_AMBULATORY_CARE_PROVIDER_SITE_OTHER): Payer: 59 | Admitting: Orthopaedic Surgery

## 2023-04-29 DIAGNOSIS — M25571 Pain in right ankle and joints of right foot: Secondary | ICD-10-CM

## 2023-04-29 MED ORDER — METHYLPREDNISOLONE ACETATE 40 MG/ML IJ SUSP
40.0000 mg | INTRAMUSCULAR | Status: AC | PRN
  Administered 2023-04-29: 40 mg via INTRA_ARTICULAR

## 2023-04-29 MED ORDER — BUPIVACAINE HCL 0.5 % IJ SOLN
1.0000 mL | INTRAMUSCULAR | Status: AC | PRN
  Administered 2023-04-29: 1 mL via INTRA_ARTICULAR

## 2023-04-29 MED ORDER — LIDOCAINE HCL 1 % IJ SOLN
1.0000 mL | INTRAMUSCULAR | Status: AC | PRN
  Administered 2023-04-29: 1 mL

## 2023-04-29 NOTE — Addendum Note (Signed)
Addended by: Wendi Maya on: 04/29/2023 09:15 AM   Modules accepted: Orders

## 2023-05-01 LAB — SEDIMENTATION RATE: Sed Rate: 28 mm/h — ABNORMAL HIGH (ref 0–20)

## 2023-05-01 LAB — ANTI-NUCLEAR AB-TITER (ANA TITER)
ANA TITER: 1:80 {titer} — ABNORMAL HIGH
ANA Titer 1: 1:320 {titer} — ABNORMAL HIGH

## 2023-05-01 LAB — RHEUMATOID FACTOR: Rheumatoid fact SerPl-aCnc: <10 IU/mL (ref <14)

## 2023-05-01 LAB — ANA: Anti Nuclear Antibody (ANA): POSITIVE — AB

## 2023-05-01 LAB — URIC ACID: Uric Acid, Serum: 5.8 mg/dL (ref 2.5–7.0)

## 2023-05-02 NOTE — Progress Notes (Signed)
Please refer to rheumatology.  Looks like every lab value is abnormally high.  Would you mind letting her know as well please.  Thanks.

## 2023-05-05 ENCOUNTER — Other Ambulatory Visit: Payer: Self-pay

## 2023-05-05 DIAGNOSIS — M25562 Pain in left knee: Secondary | ICD-10-CM

## 2023-05-05 DIAGNOSIS — M25571 Pain in right ankle and joints of right foot: Secondary | ICD-10-CM

## 2023-05-05 NOTE — Progress Notes (Signed)
Notified patient's husband and placed referral.

## 2023-11-03 ENCOUNTER — Ambulatory Visit: Payer: 59 | Attending: Internal Medicine | Admitting: Internal Medicine

## 2023-11-03 ENCOUNTER — Encounter: Payer: Self-pay | Admitting: Internal Medicine

## 2023-11-03 VITALS — BP 101/65 | HR 57 | Resp 14 | Ht 66.5 in | Wt 211.0 lb

## 2023-11-03 DIAGNOSIS — M25562 Pain in left knee: Secondary | ICD-10-CM | POA: Diagnosis not present

## 2023-11-03 DIAGNOSIS — G8929 Other chronic pain: Secondary | ICD-10-CM | POA: Diagnosis not present

## 2023-11-03 DIAGNOSIS — R768 Other specified abnormal immunological findings in serum: Secondary | ICD-10-CM | POA: Insufficient documentation

## 2023-11-03 DIAGNOSIS — M25571 Pain in right ankle and joints of right foot: Secondary | ICD-10-CM

## 2023-11-03 NOTE — Progress Notes (Signed)
 Office Visit Note  Patient: Valerie [name]             Date of Birth: [birth year]           MRN: [id number]             PCP: Pcp, No Referring: Jerri Kay HERO, MD Visit Date: 11/03/2023  Subjective:  New Patient (Initial Visit) (Patient states she has pain in her wrists, knees, and ankles. )   Discussed the use of AI scribe software for clinical note transcription with the patient, who gave verbal consent to proceed.  History of Present Illness   Valerie [name] is a 38 y.o. female here for evaluation of positive ANA and elevated sedimentation rate associated with chronic joint pain. The patient reports falling from a height of thirty feet in March of the previous year with persistent pain in the right foot and ankle. The pain has been constant since the fall, with episodes of increased intensity. The patient reports visible changes in the foot, including swelling and redness, which are exacerbated by prolonged standing or walking. The patient also reports difficulty in walking due to the pain and swelling.  In addition to the foot pain, the patient has been experiencing intermittent pain in the hands and wrists since the fall. The patient denies any direct trauma to the hands or wrists during the fall. The patient reports that the hand and wrist pain is not constant, but comes and goes.  The patient has been managing the pain with over-the-counter Advil , taking two pills when the pain is severe. The patient reports that the pain is severe enough to require Advil  approximately once a day.  The patient also reports difficulty in getting up after sitting for prolonged periods due to pain in the knee and foot. The patient describes the pain as being particularly severe in the morning, often requiring assistance to walk.  The patient has also noticed a decrease in grip strength, with difficulty in performing tasks that require a strong grip. The patient denies any visible changes in the  hands or wrists, such as swelling or redness.  The patient's symptoms have been accompanied by abnormal lab test results, including a positive ANA test and elevated sedimentation rate, suggestive of an autoimmune condition. The patient's symptoms and lab results have raised concerns about a possible inflammatory arthritis condition, such as rheumatoid arthritis or lupus.   No raynaud's symptoms, no lymphadenopathy, no oral or nasal ulcers, mild eye and mouth dryness.  Activities of Daily Living:  Patient reports morning stiffness for 90 minutes.   Patient Reports nocturnal pain.  Difficulty dressing/grooming: Reports Difficulty climbing stairs: Reports Difficulty getting out of chair: Reports Difficulty using hands for taps, buttons, cutlery, and/or writing: Reports  Review of Systems  Constitutional:  Negative for fatigue.  HENT:  Negative for mouth sores and mouth dryness.   Eyes:  Negative for dryness.  Respiratory:  Negative for shortness of breath.   Cardiovascular:  Negative for chest pain and palpitations.  Gastrointestinal:  Negative for blood in stool, constipation and diarrhea.  Endocrine: Negative for increased urination.  Genitourinary:  Negative for involuntary urination.  Musculoskeletal:  Positive for joint pain, joint pain, joint swelling, myalgias, muscle weakness, morning stiffness, muscle tenderness and myalgias. Negative for gait problem.  Skin:  Positive for sensitivity to sunlight. Negative for color change, rash and hair loss.  Allergic/Immunologic: Positive for susceptible to infections.  Neurological:  Positive for dizziness and headaches.  Hematological:  Negative for swollen glands.  Psychiatric/Behavioral:  Negative for depressed mood and sleep disturbance. The patient is nervous/anxious.     PMFS History:  Patient Active Problem List   Diagnosis Date Noted   Positive ANA (antinuclear antibody) 11/03/2023   Pain in left knee 11/03/2023   Pain in right  ankle and joints of right foot 03/17/2023   DUB (dysfunctional uterine bleeding) 11/17/2017   Anemia due to blood loss, chronic 11/17/2017   History of unexplained stillbirth 08/22/2016   History of 2 cesarean sections 04/29/2016    Past Medical History:  Diagnosis Date   Asthma    PONV (postoperative nausea and vomiting)     Family History  Problem Relation Age of Onset   Hypertension Mother    Past Surgical History:  Procedure Laterality Date   CESAREAN SECTION     CESAREAN SECTION WITH BILATERAL TUBAL LIGATION Bilateral 08/29/2016   Procedure: CESAREAN SECTION WITH BILATERAL TUBAL LIGATION;  Surgeon: Lynwood KANDICE Solomons, MD;  Location: [facility];  Service: Obstetrics;  Laterality: Bilateral;   CHOLECYSTECTOMY N/A 06/23/2017   Procedure: LAPAROSCOPIC CHOLECYSTECTOMY;  Surgeon: Sebastian Moles, MD;  Location: [facility] OR;  Service: General;  Laterality: N/A;   OVARIAN CYST REMOVAL     Social History   Social History Narrative   Not on file   Immunization History  Administered Date(s) Administered   Influenza,inj,Quad PF,6+ Mos 07/04/2016   Tdap 07/04/2016, 12/29/2021     Objective: Vital Signs: BP 101/65 (BP Location: Right Arm, Patient Position: Sitting, Cuff Size: Normal)   Pulse (!) 57   Resp 14   Ht 5' 6.5 (1.689 m)   Wt 211 lb (95.7 kg)   Breastfeeding No   BMI 33.55 kg/m    Physical Exam HENT:     Mouth/Throat:     Mouth: Mucous membranes are moist.     Pharynx: Oropharynx is clear.  Eyes:     Conjunctiva/sclera: Conjunctivae normal.  Cardiovascular:     Rate and Rhythm: Normal rate and regular rhythm.  Pulmonary:     Effort: Pulmonary effort is normal.     Breath sounds: Normal breath sounds.  Musculoskeletal:     Right lower leg: No edema.     Left lower leg: No edema.  Lymphadenopathy:     Cervical: No cervical adenopathy.  Skin:    General: Skin is warm and dry.     Findings: No rash.  Neurological:     Mental Status: She is alert.   Psychiatric:        Mood and Affect: Mood normal.      Musculoskeletal Exam:  Shoulders full ROM no tenderness or swelling Elbows full ROM no tenderness or swelling Wrists full ROM no tenderness or swelling Fingers full ROM no tenderness or swelling No paraspinal tenderness to palpation over upper and lower back Hip normal internal and external rotation without pain, no tenderness to lateral hip palpation Knees full ROM left knee tenderness at superior and inferior borders of patella, positive grind test, right knee lateral tenderness to pressure Very tender along lateral side of shin Right ankle very tender to pressure and with movement in all directions, very restricted inversion and eversion ROM, swelling present distal to lateral malleolus   Investigation: No additional findings.  Imaging: No results found.  Recent Labs: Lab Results  Component Value Date   WBC 5.3 11/17/2017   HGB 8.1 (L) 11/17/2017   PLT 432 (H) 11/17/2017   NA 137 06/27/2017   K 3.6  06/27/2017   CL 104 06/27/2017   CO2 26 06/27/2017   GLUCOSE 97 06/27/2017   BUN 5 (L) 06/27/2017   CREATININE 0.50 06/27/2017   BILITOT 0.2 (L) 06/23/2017   ALKPHOS 60 06/23/2017   AST 12 (L) 06/23/2017   ALT 13 (L) 06/23/2017   PROT 6.4 (L) 06/23/2017   ALBUMIN 3.2 (L) 06/23/2017   CALCIUM 9.3 06/27/2017   GFRAA >60 06/27/2017    Speciality Comments: No specialty comments available.  Procedures:  No procedures performed Allergies: Patient has no known allergies.   Assessment / Plan:     Visit Diagnoses: Pain in right ankle and joints of right foot Positive ANA (antinuclear antibody) - Plan: RNP Antibody, Anti-Smith antibody, Sjogrens syndrome-A extractable nuclear antibody, Sjogrens syndrome-B extractable nuclear antibody, Anti-DNA antibody, double-stranded, C3 and C4, Sedimentation rate, Cyclic citrul peptide antibody, IgG Chronic pain and swelling since a fall one year ago. MRI and X-ray show  post-traumatic arthritis. Could be trauma induced inflammatory arthritis vs just posttraumatic osteoarthritis. Less consistent for CRPS. Could have some peroneal nerve pathology as well with the symptoms extending from knee level. No evidence of systemic inflammation changes on history and exam. -Order additional blood tests to further investigate possible autoimmune conditions as above. -Consider potential treatments for inflammation if blood tests are positive. Possible GC trial or DMARD trial if present  Chronic pain of left knee Patellofemoral Pain Syndrome Pain in the front of the knee, likely due to abnormal gait from ankle pain and potentially weak quadriceps. -Recommend exercises to strengthen the quadriceps. -Advise continued use of Advil  as needed for pain.  Left Wrist Pain Intermittent pain since the same fall. No visible changes or swelling noted. -Continue monitoring symptoms.   Orders: Orders Placed This Encounter  Procedures   RNP Antibody   Anti-Smith antibody   Sjogrens syndrome-A extractable nuclear antibody   Sjogrens syndrome-B extractable nuclear antibody   Anti-DNA antibody, double-stranded   C3 and C4   Sedimentation rate   Cyclic citrul peptide antibody, IgG   No orders of the defined types were placed in this encounter.   Follow-Up Instructions: No follow-ups on file.   Lonni LELON Ester, MD  Note - This record has been created using [employer].  Chart creation errors have been sought, but may not always  have been located. Such creation errors do not reflect on  the standard of medical care.

## 2023-11-04 LAB — ANTI-DNA ANTIBODY, DOUBLE-STRANDED: ds DNA Ab: <1 [IU]/mL

## 2023-11-04 LAB — SJOGRENS SYNDROME-B EXTRACTABLE NUCLEAR ANTIBODY: SSB (La) (ENA) Antibody, IgG: <1 AI

## 2023-11-04 LAB — C3 AND C4
C3 Complement: 158 mg/dL (ref 83–193)
C4 Complement: 20 mg/dL (ref 15–57)

## 2023-11-04 LAB — SEDIMENTATION RATE: Sed Rate: 31 mm/h — ABNORMAL HIGH (ref 0–20)

## 2023-11-04 LAB — RNP ANTIBODY: Ribonucleic Protein(ENA) Antibody, IgG: <1 AI

## 2023-11-04 LAB — CYCLIC CITRUL PEPTIDE ANTIBODY, IGG: Cyclic Citrullin Peptide Ab: <16 U

## 2023-11-04 LAB — SJOGRENS SYNDROME-A EXTRACTABLE NUCLEAR ANTIBODY: SSA (Ro) (ENA) Antibody, IgG: <1 AI

## 2023-11-04 LAB — ANTI-SMITH ANTIBODY: ENA SM Ab Ser-aCnc: <1 AI

## 2024-01-07 ENCOUNTER — Telehealth: Payer: Self-pay | Admitting: *Deleted

## 2024-01-07 NOTE — Telephone Encounter (Signed)
Patient calling requesting lab results.

## 2024-01-09 NOTE — Telephone Encounter (Signed)
 Attempted to contact the patient with interpreter Camila, ID [phone]. Patient did not answer so interpreter left a message for the patient to contact the office for lab results.

## 2024-01-09 NOTE — Telephone Encounter (Signed)
 Her antibody tests for the positive ANA were all negative so I do not see evidence of a specific disease to explain the positive test result. Her sedimentation rate test was slightly elevated at 31 that indicates some inflammation. I did not recommend starting any rheumatology condition medications based on this. If symptoms are not improving at all we could recheck in about 6 months (~July), and if these results are worse try starting a long term medication for this.

## 2024-01-12 NOTE — Telephone Encounter (Signed)
 I called patient using interpreter Diego ID [phone], patient verbalized understanding, Her antibody tests for the positive ANA were all negative so I do not see evidence of a specific disease to explain the positive test result. Her sedimentation rate test was slightly elevated at 31 that indicates some inflammation. I did not recommend starting any rheumatology condition medications based on this. If symptoms are not improving at all we could recheck in about 6 months (~July), and if these results are worse try starting a long term medication for this.

## 2024-03-03 DIAGNOSIS — R600 Localized edema: Secondary | ICD-10-CM | POA: Insufficient documentation

## 2024-03-03 DIAGNOSIS — R079 Chest pain, unspecified: Secondary | ICD-10-CM | POA: Diagnosis present

## 2024-03-03 DIAGNOSIS — R1013 Epigastric pain: Secondary | ICD-10-CM | POA: Diagnosis not present

## 2024-03-03 DIAGNOSIS — G8929 Other chronic pain: Secondary | ICD-10-CM | POA: Insufficient documentation

## 2024-03-03 DIAGNOSIS — M25571 Pain in right ankle and joints of right foot: Secondary | ICD-10-CM | POA: Diagnosis present

## 2024-03-03 DIAGNOSIS — R110 Nausea: Secondary | ICD-10-CM | POA: Diagnosis not present

## 2024-03-03 DIAGNOSIS — D72829 Elevated white blood cell count, unspecified: Secondary | ICD-10-CM | POA: Diagnosis not present

## 2024-03-03 DIAGNOSIS — E876 Hypokalemia: Secondary | ICD-10-CM | POA: Diagnosis not present

## 2024-03-04 ENCOUNTER — Other Ambulatory Visit: Payer: Self-pay

## 2024-03-04 ENCOUNTER — Emergency Department (HOSPITAL_COMMUNITY)

## 2024-03-04 ENCOUNTER — Encounter (HOSPITAL_COMMUNITY): Payer: Self-pay | Admitting: Emergency Medicine

## 2024-03-04 ENCOUNTER — Emergency Department (HOSPITAL_COMMUNITY)
Admission: EM | Admit: 2024-03-04 | Discharge: 2024-03-04 | Disposition: A | Discharge status: Home / Self Care | Attending: Emergency Medicine | Admitting: Emergency Medicine

## 2024-03-04 ENCOUNTER — Encounter (HOSPITAL_COMMUNITY): Payer: Self-pay | Admitting: *Deleted

## 2024-03-04 ENCOUNTER — Emergency Department (HOSPITAL_COMMUNITY)
Admission: EM | Admit: 2024-03-04 | Discharge: 2024-03-04 | Disposition: A | Discharge status: Home / Self Care | Source: Home / Self Care | Attending: Emergency Medicine | Admitting: Emergency Medicine

## 2024-03-04 DIAGNOSIS — D72829 Elevated white blood cell count, unspecified: Secondary | ICD-10-CM | POA: Insufficient documentation

## 2024-03-04 DIAGNOSIS — G8929 Other chronic pain: Secondary | ICD-10-CM

## 2024-03-04 DIAGNOSIS — R110 Nausea: Secondary | ICD-10-CM | POA: Insufficient documentation

## 2024-03-04 DIAGNOSIS — R079 Chest pain, unspecified: Secondary | ICD-10-CM | POA: Insufficient documentation

## 2024-03-04 DIAGNOSIS — E876 Hypokalemia: Secondary | ICD-10-CM | POA: Insufficient documentation

## 2024-03-04 DIAGNOSIS — R1013 Epigastric pain: Secondary | ICD-10-CM | POA: Insufficient documentation

## 2024-03-04 LAB — COMPREHENSIVE METABOLIC PANEL WITH GFR
ALT: 52 U/L — ABNORMAL HIGH (ref 0–44)
AST: 62 U/L — ABNORMAL HIGH (ref 15–41)
Albumin: 3.6 g/dL (ref 3.5–5.0)
Alkaline Phosphatase: 104 U/L (ref 38–126)
Anion gap: 12 (ref 5–15)
BUN: 8 mg/dL (ref 6–20)
CO2: 21 mmol/L — ABNORMAL LOW (ref 22–32)
Calcium: 8.8 mg/dL — ABNORMAL LOW (ref 8.9–10.3)
Chloride: 103 mmol/L (ref 98–111)
Creatinine, Ser: 0.66 mg/dL (ref 0.44–1.00)
GFR, Estimated: >60 mL/min (ref >60)
Glucose, Bld: 136 mg/dL — ABNORMAL HIGH (ref 70–99)
Potassium: 3 mmol/L — ABNORMAL LOW (ref 3.5–5.1)
Sodium: 136 mmol/L (ref 135–145)
Total Bilirubin: 0.8 mg/dL (ref 0.0–1.2)
Total Protein: 7.2 g/dL (ref 6.5–8.1)

## 2024-03-04 LAB — CBC WITH DIFFERENTIAL/PLATELET
Abs Immature Granulocytes: 0.08 10*3/uL — ABNORMAL HIGH (ref 0.00–0.07)
Basophils Absolute: 0.1 10*3/uL (ref 0.0–0.1)
Basophils Relative: 0 %
Eosinophils Absolute: 0.6 10*3/uL — ABNORMAL HIGH (ref 0.0–0.5)
Eosinophils Relative: 3 %
HCT: 34.2 % — ABNORMAL LOW (ref 36.0–46.0)
Hemoglobin: 10.9 g/dL — ABNORMAL LOW (ref 12.0–15.0)
Immature Granulocytes: 0 %
Lymphocytes Relative: 15 %
Lymphs Abs: 2.9 10*3/uL (ref 0.7–4.0)
MCH: 25.1 pg — ABNORMAL LOW (ref 26.0–34.0)
MCHC: 31.9 g/dL (ref 30.0–36.0)
MCV: 78.8 fL — ABNORMAL LOW (ref 80.0–100.0)
Monocytes Absolute: 0.9 10*3/uL (ref 0.1–1.0)
Monocytes Relative: 5 %
Neutro Abs: 14.9 10*3/uL — ABNORMAL HIGH (ref 1.7–7.7)
Neutrophils Relative %: 77 %
Platelets: 390 10*3/uL (ref 150–400)
RBC: 4.34 MIL/uL (ref 3.87–5.11)
RDW: 15.3 % (ref 11.5–15.5)
WBC: 19.4 10*3/uL — ABNORMAL HIGH (ref 4.0–10.5)
nRBC: 0 % (ref 0.0–0.2)

## 2024-03-04 LAB — URINALYSIS, ROUTINE W REFLEX MICROSCOPIC
Bilirubin Urine: NEGATIVE
Glucose, UA: NEGATIVE mg/dL
Hgb urine dipstick: NEGATIVE
Ketones, ur: NEGATIVE mg/dL
Nitrite: NEGATIVE
Protein, ur: NEGATIVE mg/dL
Specific Gravity, Urine: >1.046 — ABNORMAL HIGH (ref 1.005–1.030)
pH: 6 (ref 5.0–8.0)

## 2024-03-04 LAB — TROPONIN I (HIGH SENSITIVITY)
Troponin I (High Sensitivity): 3 ng/L (ref <18)
Troponin I (High Sensitivity): 5 ng/L (ref <18)

## 2024-03-04 LAB — LIPASE, BLOOD: Lipase: 31 U/L (ref 11–51)

## 2024-03-04 LAB — HCG, SERUM, QUALITATIVE: Preg, Serum: NEGATIVE

## 2024-03-04 MED ORDER — MORPHINE SULFATE (PF) 4 MG/ML IV SOLN
4.0000 mg | Freq: Once | INTRAVENOUS | Status: AC
  Administered 2024-03-04: 4 mg via INTRAVENOUS
  Filled 2024-03-04: qty 1

## 2024-03-04 MED ORDER — FAMOTIDINE 20 MG PO TABS: 20.0000 mg | ORAL_TABLET | Freq: Every day | ORAL | 0 refills | Status: AC

## 2024-03-04 MED ORDER — POTASSIUM CHLORIDE CRYS ER 20 MEQ PO TBCR
40.0000 meq | EXTENDED_RELEASE_TABLET | Freq: Once | ORAL | Status: AC
  Administered 2024-03-04: 40 meq via ORAL
  Filled 2024-03-04: qty 2

## 2024-03-04 MED ORDER — ONDANSETRON HCL 4 MG PO TABS
4.0000 mg | ORAL_TABLET | Freq: Once | ORAL | Status: AC
  Administered 2024-03-04: 4 mg via ORAL
  Filled 2024-03-04: qty 1

## 2024-03-04 MED ORDER — ONDANSETRON HCL 4 MG/2ML IJ SOLN
4.0000 mg | Freq: Once | INTRAMUSCULAR | Status: AC
  Administered 2024-03-04: 4 mg via INTRAVENOUS
  Filled 2024-03-04: qty 2

## 2024-03-04 MED ORDER — LORAZEPAM 2 MG/ML IJ SOLN
1.0000 mg | Freq: Once | INTRAMUSCULAR | Status: AC
  Administered 2024-03-04: 1 mg via INTRAVENOUS
  Filled 2024-03-04: qty 1

## 2024-03-04 MED ORDER — ALUM & MAG HYDROXIDE-SIMETH 200-200-20 MG/5ML PO SUSP
30.0000 mL | Freq: Once | ORAL | Status: AC
  Administered 2024-03-04: 30 mL via ORAL
  Filled 2024-03-04: qty 30

## 2024-03-04 MED ORDER — OXYCODONE-ACETAMINOPHEN 5-325 MG PO TABS
2.0000 | ORAL_TABLET | Freq: Once | ORAL | Status: AC
  Administered 2024-03-04: 2 via ORAL
  Filled 2024-03-04: qty 2

## 2024-03-04 MED ORDER — ONDANSETRON HCL 4 MG PO TABS: 4.0000 mg | ORAL_TABLET | Freq: Four times a day (QID) | ORAL | 0 refills | Status: AC

## 2024-03-04 MED ORDER — CELECOXIB 100 MG PO CAPS: 100.0000 mg | ORAL_CAPSULE | Freq: Two times a day (BID) | ORAL | 0 refills | Status: AC

## 2024-03-04 MED ORDER — ACETAMINOPHEN 325 MG PO TABS
650.0000 mg | ORAL_TABLET | Freq: Once | ORAL | Status: AC
  Administered 2024-03-04: 650 mg via ORAL
  Filled 2024-03-04: qty 2

## 2024-03-04 MED ORDER — IOHEXOL 350 MG/ML SOLN
75.0000 mL | Freq: Once | INTRAVENOUS | Status: AC | PRN
  Administered 2024-03-04: 75 mL via INTRAVENOUS

## 2024-03-04 MED ORDER — KETOROLAC TROMETHAMINE 60 MG/2ML IM SOLN
30.0000 mg | Freq: Once | INTRAMUSCULAR | Status: AC
  Administered 2024-03-04: 30 mg via INTRAMUSCULAR
  Filled 2024-03-04: qty 2

## 2024-03-04 MED ORDER — FAMOTIDINE IN NACL 20-0.9 MG/50ML-% IV SOLN
20.0000 mg | Freq: Once | INTRAVENOUS | Status: AC
  Administered 2024-03-04: 20 mg via INTRAVENOUS
  Filled 2024-03-04: qty 50

## 2024-03-04 MED ORDER — METHYLPREDNISOLONE 4 MG PO TBPK: ORAL_TABLET | ORAL | 0 refills | Status: AC

## 2024-03-04 MED ORDER — SODIUM CHLORIDE 0.9 % IV BOLUS
1000.0000 mL | Freq: Once | INTRAVENOUS | Status: AC
  Administered 2024-03-04: 1000 mL via INTRAVENOUS

## 2024-03-04 MED ORDER — ONDANSETRON HCL 4 MG/2ML IJ SOLN: 4.0000 mg | Freq: Once | INTRAMUSCULAR | Status: DC

## 2024-03-04 NOTE — Discharge Instructions (Signed)
 You were seen in the emergency department for your abdominal and chest pain.  Your symptoms did not seem consistent with an allergic reaction and you had no other signs of allergic reaction on your exam or evaluation in the emergency department.  Your workup showed no signs of heart attack or stress on your heart or obvious signs of infection.  You may have acid reflux and I have given you an antacid that you should take daily for at least the next 2 weeks or while you are on the Celebrex for your ankle.  I would avoid other NSAIDs like Advil  as this can worsen acid reflux.  You can take Zofran  as needed for nausea as well as Tylenol  as needed for headaches or pain.  You should follow-up with your primary doctor in the next few days to have your symptoms rechecked.  You can follow-up with GI if you have continued symptoms in the next 1 to 2 weeks.  You should return to the emergency department if you have significantly worsening pain, repetitive vomiting despite the nausea medicine, severe shortness of breath, swelling to your lips, tongue or throat, you break out in hives or if you have any other new or concerning symptoms.  Lo atendieron en urgencias por dolor abdominal y torcico. Sus sntomas no parecan corresponder a Runner, broadcasting/film/video y no present otros signos de reaccin alrgica en su examen o evaluacin en urgencias. Su evaluacin diagnstica no mostr signos de infarto, estrs cardaco ni signos evidentes de infeccin. Es posible que tenga reflujo cido y Chief Executive Officer he recetado un anticido que debe tomar a diario durante al [employer] las [employer] o mientras est tomando Celebrex para el tobillo. Le recomiendo evitar otros AINE como Advil , ya que pueden empeorar el reflujo cido. Puede tomar Zofran  segn sea necesario para las nuseas, as como Tylenol  segn sea necesario para el dolor de cabeza o Chief Technology Officer. Debe consultar con su mdico de cabecera en los prximos das para que le revisen los  sntomas. Puede consultar con un gastroenterlogo si los sntomas persisten en las [employer]. Debe regresar al departamento de emergencias si tiene un dolor que empeora significativamente, vmitos repetitivos a pesar del medicamento para las nuseas, dificultad para respirar severa, hinchazn en los labios, la lengua o la garganta, si presenta urticaria o si tiene otros sntomas nuevos o preocupantes.

## 2024-03-04 NOTE — ED Notes (Signed)
 Patient ambulated to restroom with minimal assistance. ?

## 2024-03-04 NOTE — ED Provider Notes (Signed)
 [place] EMERGENCY DEPARTMENT AT [facility] Provider Note   CSN: [id number] Arrival date & time: 03/04/24  9147     History  Chief Complaint  Patient presents with   Allergic Reaction    Valerie [name] is a 38 y.o. female.  Patient is a 38 year old female with a past medical history of arthralgias and positive ANA on Advil  as needed presenting to the emergency department with chest pain and shortness of breath.  Patient states that she was seen in the ED yesterday for her ankle pain and was given pain medication and discharged on a steroid Dosepak.  She states that her ankle pain had improved however when she got home she developed chest pain in her epigastrium to her mid sternum of her chest with shortness of breath.  She states that she felt like she could not talk or move her hands and 911 was called.  She denied any associated tongue or throat swelling, rash or itching.  EMS with concern for possible allergic reaction and did give her EpiPen .  She states that she is still having the chest pain but has since been able to move her hands and talk.  She denies any associated nausea or vomiting or cough.  She states that the chest pain feels like a pressure.  She denies any lower extremity swelling.  The history is provided by the patient. A language interpreter was used (Spanish Uriel ID [phone]).  Allergic Reaction      Home Medications Prior to Admission medications   Medication Sig Start Date End Date Taking? Authorizing Provider  famotidine  (PEPCID ) 20 MG tablet Take 1 tablet (20 mg total) by mouth daily. 03/04/24  Yes Nora Beal, Jackston [place] K, DO  ondansetron  (ZOFRAN ) 4 MG tablet Take 1 tablet (4 mg total) by mouth every 6 (six) hours. 03/04/24  Yes Nora Beal, Cicley Ganesh K, DO  albuterol  (PROAIR  HFA) 108 (90 Base) MCG/ACT inhaler Inhale 1-2 puffs into the lungs every 6 (six) hours as needed for wheezing or shortness of breath. 09/07/18   Burky, Natalie B, NP  celecoxib  (CELEBREX) 100 MG capsule Take 1 capsule (100 mg total) by mouth 2 (two) times daily for 14 days. 03/04/24 03/18/24  Mesner, Reymundo Caulk, MD  ibuprofen  (ADVIL ) 600 MG tablet Take 1 tablet (600 mg total) by mouth every 6 (six) hours as needed. 01/08/20   Debbra Fairy, PA-C  methylPREDNISolone  (MEDROL  DOSEPAK) 4 MG TBPK tablet Take per packet instructions 03/04/24   Mesner, Reymundo Caulk, MD      Allergies    Patient has no known allergies.    Review of Systems   Review of Systems  Physical Exam Updated Vital Signs BP 126/64 (BP Location: Right Arm)   Pulse 67   Temp 98 F (36.7 C) (Oral)   Resp 18   SpO2 99%  Physical Exam Vitals and nursing note reviewed.  Constitutional:      General: She is not in acute distress.    Appearance: Normal appearance.  HENT:     Head: Normocephalic and atraumatic.     Nose: Nose normal.     Mouth/Throat:     Mouth: Mucous membranes are moist.     Pharynx: Oropharynx is clear.     Comments: No swelling to the lips, tongue or posterior oropharynx Eyes:     Extraocular Movements: Extraocular movements intact.     Conjunctiva/sclera: Conjunctivae normal.  Cardiovascular:     Rate and Rhythm: Normal rate and regular rhythm.  Heart sounds: Normal heart sounds.  Pulmonary:     Effort: Pulmonary effort is normal.     Breath sounds: Normal breath sounds. No stridor. No wheezing.  Abdominal:     General: Abdomen is flat.     Palpations: Abdomen is soft.     Tenderness: There is abdominal tenderness (Epigastrium).  Musculoskeletal:        General: Normal range of motion.     Cervical back: Normal range of motion.     Right lower leg: No edema.     Left lower leg: No edema.  Skin:    General: Skin is warm and dry.     Findings: No rash.  Neurological:     General: No focal deficit present.     Mental Status: She is alert and oriented to person, place, and time.  Psychiatric:        Mood and Affect: Mood normal.        Behavior: Behavior normal.     ED  Results / Procedures / Treatments   Labs (all labs ordered are listed, but only abnormal results are displayed) Labs Reviewed  COMPREHENSIVE METABOLIC PANEL WITH GFR - Abnormal; Notable for the following components:      Result Value   Potassium 3.0 (*)    CO2 21 (*)    Glucose, Bld 136 (*)    Calcium 8.8 (*)    AST 62 (*)    ALT 52 (*)    All other components within normal limits  CBC WITH DIFFERENTIAL/PLATELET - Abnormal; Notable for the following components:   WBC 19.4 (*)    Hemoglobin 10.9 (*)    HCT 34.2 (*)    MCV 78.8 (*)    MCH 25.1 (*)    Neutro Abs 14.9 (*)    Eosinophils Absolute 0.6 (*)    Abs Immature Granulocytes 0.08 (*)    All other components within normal limits  URINALYSIS, ROUTINE W REFLEX MICROSCOPIC - Abnormal; Notable for the following components:   APPearance CLOUDY (*)    Specific Gravity, Urine >1.046 (*)    Leukocytes,Ua LARGE (*)    Bacteria, UA RARE (*)    All other components within normal limits  LIPASE, BLOOD  HCG, SERUM, QUALITATIVE  TROPONIN I (HIGH SENSITIVITY)  TROPONIN I (HIGH SENSITIVITY)    EKG EKG Interpretation Date/Time:  Thursday Mar 04 2024 08:09:40 EDT Ventricular Rate:  78 PR Interval:  158 QRS Duration:  84 QT Interval:  380 QTC Calculation: 433 R Axis:   25  Text Interpretation: Normal sinus rhythm with sinus arrhythmia Nonspecific ST and T wave abnormality Abnormal ECG No significant change since last tracing Confirmed by Celesta Coke ([id number]) on 03/04/2024 8:16:25 AM  Radiology CT ABDOMEN PELVIS W CONTRAST Result Date: 03/04/2024 CLINICAL DATA:  Abdominal pain, acute, nonlocalized. EXAM: CT ABDOMEN AND PELVIS WITH CONTRAST TECHNIQUE: Multidetector CT imaging of the abdomen and pelvis was performed using the standard protocol following bolus administration of intravenous contrast. RADIATION DOSE REDUCTION: This exam was performed according to the departmental dose-optimization program which includes automated  exposure control, adjustment of the mA and/or kV according to patient size and/or use of iterative reconstruction technique. CONTRAST:  75mL OMNIPAQUE IOHEXOL 350 MG/ML SOLN COMPARISON:  None Available. FINDINGS: Lower chest: There are patchy atelectatic changes in the visualized lung bases. No overt consolidation. No pleural effusion. The heart is normal in size. No pericardial effusion. Hepatobiliary: The liver is normal in size. Non-cirrhotic configuration. No suspicious mass. These is  mild diffuse hepatic steatosis. No intrahepatic or extrahepatic bile duct dilation. Gallbladder is surgically absent. Pancreas: Unremarkable. No pancreatic ductal dilatation or surrounding inflammatory changes. Spleen: Within normal limits. No focal lesion. Adrenals/Urinary Tract: Adrenal glands are unremarkable. No suspicious renal mass. No hydronephrosis. No renal or ureteric calculi. Unremarkable urinary bladder. Stomach/Bowel: No disproportionate dilation of the small or large bowel loops. No evidence of abnormal bowel wall thickening or inflammatory changes. The appendix is unremarkable. Vascular/Lymphatic: No ascites or pneumoperitoneum. No abdominal or pelvic lymphadenopathy, by size criteria. No aneurysmal dilation of the major abdominal arteries. Reproductive: Reproductive organs are not well evaluated on the CT scan exam. However, having said that, note is made of bulky retroverted uterus exhibiting T-shaped intrauterine device, which appears in satisfactory position. No large uterine mass seen. Right ovary is not distinctly visualized. No large right adnexal mass seen. There are several cystic structures within the left ovary measuring up to 1.9 cm, incompletely characterized on the current exam. Differential diagnosis includes multiple ovarian follicles, hydrosalpinx, etc. Correlate clinically to determine the need for additional imaging with pelvic ultrasound. Other: There is a tiny fat containing umbilical hernia. The  soft tissues and abdominal wall are otherwise unremarkable. Musculoskeletal: No suspicious osseous lesions. IMPRESSION: 1. No acute inflammatory process identified within the abdomen or pelvis. 2. Multiple other nonacute observations, as described above. Electronically Signed   By: Beula Brunswick M.D.   On: 03/04/2024 11:04   US  Abdomen Limited RUQ (LIVER/GB) Result Date: 03/04/2024 CLINICAL DATA:  Elevated LFT EXAM: ULTRASOUND ABDOMEN LIMITED RIGHT UPPER QUADRANT COMPARISON:  None Available. FINDINGS: Gallbladder: Prior cholecystectomy 2018 Common bile duct: Diameter: 2.7 mm Liver: Echogenic heterogeneous liver correlate with diffuse fatty liver changes without parenchymal lesions portal vein is patent on color Doppler imaging with normal direction of blood flow towards the liver. Other: None. IMPRESSION: *Diffuse fatty liver changes. *Prior cholecystectomy. Electronically Signed   By: Fredrich Jefferson M.D.   On: 03/04/2024 10:05   DG Chest 2 View Result Date: 03/04/2024 CLINICAL DATA:  Shortness of breath, chest pain EXAM: CHEST - 2 VIEW COMPARISON:  June 27, 2017 FINDINGS: The heart size and mediastinal contours are within normal limits. Both lungs are clear. The visualized skeletal structures are unremarkable. IMPRESSION: No active cardiopulmonary disease. Electronically Signed   By: Fredrich Jefferson M.D.   On: 03/04/2024 10:05    Procedures Procedures    Medications Ordered in ED Medications  ondansetron  (ZOFRAN ) injection 4 mg (has no administration in time range)  alum & mag hydroxide-simeth (MAALOX/MYLANTA) 200-200-20 MG/5ML suspension 30 mL (has no administration in time range)  acetaminophen  (TYLENOL ) tablet 650 mg (650 mg Oral Given 03/04/24 0850)  famotidine  (PEPCID ) IVPB 20 mg premix (0 mg Intravenous Stopped 03/04/24 0930)  LORazepam (ATIVAN) injection 1 mg (1 mg Intravenous Given 03/04/24 0850)  sodium chloride  0.9 % bolus 1,000 mL (0 mLs Intravenous Stopped 03/04/24 1018)  potassium  chloride SA (KLOR-CON M) CR tablet 40 mEq (40 mEq Oral Given 03/04/24 0930)  morphine  (PF) 4 MG/ML injection 4 mg (4 mg Intravenous Given 03/04/24 1039)  ondansetron  (ZOFRAN ) injection 4 mg (4 mg Intravenous Given 03/04/24 1039)  iohexol (OMNIPAQUE) 350 MG/ML injection 75 mL (75 mLs Intravenous Contrast Given 03/04/24 1049)    ED Course/ Medical Decision Making/ A&P Clinical Course as of 03/04/24 1351  Thu Mar 04, 2024  0845 Patient did become anxious in x-ray, starting to hyperventilate. Was given ativan for anxiolysis.  [VK]  [id number] Initial troponin negative, will need repeat with  symptoms starting shortly prior to arrival. [VK]  0927 Mildly elevated LFTs, will have RUQ US . Potassium will be repleted. [VK]  1014 Fatty liver changes, prior cholecystectomy on US . CXR without acute disease. [VK]  1024 Patient reports she is still having pain in the epigastrium and nausea without improvement with pepcid . With leukocytosis will have CTAP and UA to evaluate for infection. Repeat troponin pending. [VK]  1158 Repeat troponin negative, CTAP without acute disease. [VK]  1310 UA with large leuks, but rare bacteria and multiple squams making UTI unlikely. Patient has been observed >4 hrs without allergic symptoms. She is stable for discharge home with outpatient follow up. [VK]    Clinical Course User Index [VK] Kingsley, Ollie Esty K, DO                                 Medical Decision Making This patient presents to the ED with chief complaint(s) of CP, SOB with pertinent past medical history of arthralgias on advil  as needed which further complicates the presenting complaint. The complaint involves an extensive differential diagnosis and also carries with it a high risk of complications and morbidity.    The differential diagnosis includes ACS, arrhythmia, pneumonia, pneumonia, pneumothorax, pulmonary edema, pleural effusion, gastritis, GERD, pancreatitis, hepatitis, no current signs of allergic reaction  on exam and symptoms this morning did not quite sound like allergic reaction making this less likely, no signs of anaphylaxis, anxiety/panic attack  Additional history obtained: Additional history obtained from EMS  Records reviewed outpatient rheumatology records  ED Course and Reassessment: On patient's arrival she is uncomfortable appearing but hemodynamically stable in no acute distress without any signs of anaphylaxis or allergic reaction on exam.  EKG on arrival showed normal sinus rhythm without acute ischemic changes.  Patient will have labs including troponin, LFTs and lipase.  She will be started on Tylenol  and antacid for symptomatic management and will be closely reassessed.  She was recommended several hours of observation after the EpiPen  to ensure that she does not develop any allergic type symptoms.  Independent labs interpretation:  The following labs were independently interpreted: leukocytosis, mild hypokalemia and transaminitis  Independent visualization of imaging: - I independently visualized the following imaging with scope of interpretation limited to determining acute life threatening conditions related to emergency care: CXR, RUQ US , CTAP, which revealed no acute disease  Consultation: - Consulted or discussed management/test interpretation w/ external professional: N/A  Consideration for admission or further workup: Patient has no emergent conditions requiring admission or further work-up at this time and is stable for discharge home with primary care follow-up  Social Determinants of health: N/A    Amount and/or Complexity of Data Reviewed Labs: ordered. Radiology: ordered.  Risk OTC drugs. Prescription drug management.          Final Clinical Impression(s) / ED Diagnoses Final diagnoses:  Epigastric pain  Nonspecific chest pain  Nausea    Rx / DC Orders ED Discharge Orders          Ordered    famotidine  (PEPCID ) 20 MG tablet  Daily         03/04/24 1348    ondansetron  (ZOFRAN ) 4 MG tablet  Every 6 hours        03/04/24 1348              Kingsley, Lailee Hoelzel K, DO 03/04/24 1351

## 2024-03-04 NOTE — ED Triage Notes (Signed)
 The pt is c/o pain and swelling in her rt ankle  she injured it years ago and it periodically swells without injury  lmp  bc

## 2024-03-04 NOTE — ED Triage Notes (Signed)
 Pt BIB by EMS for suspected allergic reaction to steroid injection given earlier. No hives noted or facial swelling. Alert and oriented en route. EMS reports fire gave 0.3 mg Epi IM prior to their arrival. Endorses intermittent chest discomfort after administration.  EMS VS: BP 122/68 HR 70 NSR 100% RA CBG 132

## 2024-03-04 NOTE — ED Provider Notes (Signed)
 [place] EMERGENCY DEPARTMENT AT [facility] Provider Note   CSN: [id number] Arrival date & time: 03/03/24  2352     History  Chief Complaint  Patient presents with   Ankle Pain    Valerie [name] is a 38 y.o. female.  Chronically intermittent right ankle pain and swelling after an injury awhile ago. Recently started again. Has had xr's, ct's, mri's and us 's without acute causes. Doesn't seem to be incited by anything but mostly random. No new injuries. No fevers. No redness. No other associated symptoms.    Ankle Pain      Home Medications Prior to Admission medications   Medication Sig Start Date End Date Taking? Authorizing Provider  celecoxib (CELEBREX) 100 MG capsule Take 1 capsule (100 mg total) by mouth 2 (two) times daily for 14 days. 03/04/24 03/18/24 Yes Lylie Blacklock, Reymundo Caulk, MD  albuterol  (PROAIR  HFA) 108 (90 Base) MCG/ACT inhaler Inhale 1-2 puffs into the lungs every 6 (six) hours as needed for wheezing or shortness of breath. 09/07/18   Burky, Natalie B, NP  famotidine  (PEPCID ) 20 MG tablet Take 1 tablet (20 mg total) by mouth 2 (two) times daily as needed for heartburn or indigestion (abdominal pain). 12/31/21   Petrucelli, Samantha R, PA-C  ibuprofen  (ADVIL ) 600 MG tablet Take 1 tablet (600 mg total) by mouth every 6 (six) hours as needed. 01/08/20   Debbra Fairy, PA-C  methylPREDNISolone  (MEDROL  DOSEPAK) 4 MG TBPK tablet Take per packet instructions 03/04/24   Janaisa Birkland, Reymundo Caulk, MD      Allergies    Patient has no known allergies.    Review of Systems   Review of Systems  Physical Exam Updated Vital Signs BP (!) 115/49   Pulse 72   Temp 98.4 F (36.9 C)   Resp 16   Ht 5\' 6"  (1.676 m)   Wt 95.7 kg   SpO2 100%   BMI 34.05 kg/m  Physical Exam Vitals and nursing note reviewed.  Constitutional:      Appearance: She is well-developed.  HENT:     Head: Normocephalic and atraumatic.  Cardiovascular:     Rate and Rhythm: Normal rate and regular rhythm.   Pulmonary:     Effort: No respiratory distress.     Breath sounds: No stridor.  Abdominal:     General: There is no distension.  Musculoskeletal:     Cervical back: Normal range of motion.     Comments: Mild r ankle edema without erythema, induration or warmth.   Neurological:     Mental Status: She is alert.     ED Results / Procedures / Treatments   Labs (all labs ordered are listed, but only abnormal results are displayed) Labs Reviewed - No data to display  EKG None  Radiology No results found.  Procedures Procedures    Medications Ordered in ED Medications  oxyCODONE -acetaminophen  (PERCOCET/ROXICET) 5-325 MG per tablet 2 tablet (2 tablets Oral Given 03/04/24 0513)  ketorolac  (TORADOL ) injection 30 mg (30 mg Intramuscular Given 03/04/24 4098)    ED Course/ Medical Decision Making/ A&P                                 Medical Decision Making Risk Prescription drug management.   Chronic itnermittent ankle pain without new injury. Has been worked up multiple times in the past per patient without acute cause. No e/o infection, low likelihood of dvt. Possibly OA? Will treat  for inflammatory conditions but highly recommended continued outpatient workup/management.   Final Clinical Impression(s) / ED Diagnoses Final diagnoses:  Chronic pain of right ankle    Rx / DC Orders ED Discharge Orders          Ordered    methylPREDNISolone  (MEDROL  DOSEPAK) 4 MG TBPK tablet        03/04/24 0455    celecoxib (CELEBREX) 100 MG capsule  2 times daily        03/04/24 0455              Kori Colin, Reymundo Caulk, MD 03/04/24 2320
